# Patient Record
Sex: Female | Born: 1965 | Race: White | Hispanic: No | Marital: Single | Smoking: Current every day smoker
Health system: Southern US, Community
[De-identification: ages and names within clinical notes are randomized; demographics above are authoritative.]

## PROBLEM LIST (undated history)

## (undated) DIAGNOSIS — F17200 Nicotine dependence, unspecified, uncomplicated: Secondary | ICD-10-CM

## (undated) DIAGNOSIS — R000 Tachycardia, unspecified: Secondary | ICD-10-CM

## (undated) DIAGNOSIS — F419 Anxiety disorder, unspecified: Secondary | ICD-10-CM

## (undated) DIAGNOSIS — Z87828 Personal history of other (healed) physical injury and trauma: Secondary | ICD-10-CM

## (undated) DIAGNOSIS — N951 Menopausal and female climacteric states: Secondary | ICD-10-CM

## (undated) DIAGNOSIS — A159 Respiratory tuberculosis unspecified: Secondary | ICD-10-CM

## (undated) DIAGNOSIS — J189 Pneumonia, unspecified organism: Secondary | ICD-10-CM

## (undated) DIAGNOSIS — N870 Mild cervical dysplasia: Secondary | ICD-10-CM

## (undated) DIAGNOSIS — E785 Hyperlipidemia, unspecified: Secondary | ICD-10-CM

## (undated) DIAGNOSIS — N189 Chronic kidney disease, unspecified: Secondary | ICD-10-CM

## (undated) DIAGNOSIS — Z8619 Personal history of other infectious and parasitic diseases: Secondary | ICD-10-CM

## (undated) DIAGNOSIS — K30 Functional dyspepsia: Secondary | ICD-10-CM

## (undated) HISTORY — DX: Tachycardia, unspecified: R00.0

## (undated) HISTORY — DX: Chronic kidney disease, unspecified: N18.9

## (undated) HISTORY — PX: CHOLECYSTECTOMY: SHX55

## (undated) HISTORY — DX: Personal history of other infectious and parasitic diseases: Z86.19

## (undated) HISTORY — DX: Hyperlipidemia, unspecified: E78.5

## (undated) HISTORY — DX: Nicotine dependence, unspecified, uncomplicated: F17.200

## (undated) HISTORY — PX: TONSILLECTOMY: SUR1361

## (undated) HISTORY — DX: Pneumonia, unspecified organism: J18.9

## (undated) HISTORY — DX: Respiratory tuberculosis unspecified: A15.9

## (undated) HISTORY — DX: Mild cervical dysplasia: N87.0

---

## 1985-02-12 DIAGNOSIS — Z87828 Personal history of other (healed) physical injury and trauma: Secondary | ICD-10-CM

## 1985-02-12 HISTORY — DX: Personal history of other (healed) physical injury and trauma: Z87.828

## 1991-02-13 DIAGNOSIS — N870 Mild cervical dysplasia: Secondary | ICD-10-CM

## 1991-02-13 HISTORY — DX: Mild cervical dysplasia: N87.0

## 2001-03-25 ENCOUNTER — Other Ambulatory Visit: Admission: RE | Admit: 2001-03-25 | Discharge: 2001-03-25 | Payer: Self-pay | Admitting: Gynecology

## 2001-09-09 ENCOUNTER — Encounter: Admission: RE | Admit: 2001-09-09 | Discharge: 2001-09-09 | Payer: Self-pay | Admitting: Gynecology

## 2001-10-10 ENCOUNTER — Inpatient Hospital Stay (HOSPITAL_COMMUNITY): Admission: AD | Admit: 2001-10-10 | Discharge: 2001-10-13 | Payer: Self-pay | Admitting: Gynecology

## 2001-11-17 ENCOUNTER — Other Ambulatory Visit: Admission: RE | Admit: 2001-11-17 | Discharge: 2001-11-17 | Payer: Self-pay | Admitting: Gynecology

## 2002-04-03 ENCOUNTER — Encounter: Admission: RE | Admit: 2002-04-03 | Discharge: 2002-04-03 | Payer: Self-pay | Admitting: Gynecology

## 2002-04-03 ENCOUNTER — Encounter: Payer: Self-pay | Admitting: Gynecology

## 2004-10-12 ENCOUNTER — Other Ambulatory Visit: Admission: RE | Admit: 2004-10-12 | Discharge: 2004-10-12 | Payer: Self-pay | Admitting: Gynecology

## 2011-01-02 ENCOUNTER — Ambulatory Visit (INDEPENDENT_AMBULATORY_CARE_PROVIDER_SITE_OTHER): Payer: 59 | Admitting: Gynecology

## 2011-01-02 ENCOUNTER — Encounter: Payer: Self-pay | Admitting: *Deleted

## 2011-01-02 ENCOUNTER — Other Ambulatory Visit (HOSPITAL_COMMUNITY)
Admission: RE | Admit: 2011-01-02 | Discharge: 2011-01-02 | Disposition: A | Discharge status: Home / Self Care | Payer: 59 | Source: Ambulatory Visit | Attending: Gynecology | Admitting: Gynecology

## 2011-01-02 VITALS — BP 124/78 | Ht 62.0 in | Wt 140.0 lb

## 2011-01-02 DIAGNOSIS — B9689 Other specified bacterial agents as the cause of diseases classified elsewhere: Secondary | ICD-10-CM

## 2011-01-02 DIAGNOSIS — N814 Uterovaginal prolapse, unspecified: Secondary | ICD-10-CM

## 2011-01-02 DIAGNOSIS — Z01419 Encounter for gynecological examination (general) (routine) without abnormal findings: Secondary | ICD-10-CM

## 2011-01-02 DIAGNOSIS — N870 Mild cervical dysplasia: Secondary | ICD-10-CM | POA: Insufficient documentation

## 2011-01-02 DIAGNOSIS — N926 Irregular menstruation, unspecified: Secondary | ICD-10-CM

## 2011-01-02 DIAGNOSIS — Z131 Encounter for screening for diabetes mellitus: Secondary | ICD-10-CM

## 2011-01-02 DIAGNOSIS — N8111 Cystocele, midline: Secondary | ICD-10-CM

## 2011-01-02 DIAGNOSIS — N760 Acute vaginitis: Secondary | ICD-10-CM

## 2011-01-02 DIAGNOSIS — R823 Hemoglobinuria: Secondary | ICD-10-CM

## 2011-01-02 DIAGNOSIS — N898 Other specified noninflammatory disorders of vagina: Secondary | ICD-10-CM

## 2011-01-02 DIAGNOSIS — E7800 Pure hypercholesterolemia, unspecified: Secondary | ICD-10-CM

## 2011-01-02 DIAGNOSIS — Z13220 Encounter for screening for lipoid disorders: Secondary | ICD-10-CM

## 2011-01-02 DIAGNOSIS — B373 Candidiasis of vulva and vagina: Secondary | ICD-10-CM

## 2011-01-02 DIAGNOSIS — B3731 Acute candidiasis of vulva and vagina: Secondary | ICD-10-CM

## 2011-01-02 DIAGNOSIS — A499 Bacterial infection, unspecified: Secondary | ICD-10-CM

## 2011-01-02 MED ORDER — FLUCONAZOLE 150 MG PO TABS: 150.0000 mg | ORAL_TABLET | Freq: Once | ORAL | Status: AC

## 2011-01-02 MED ORDER — CLINDAMYCIN PHOSPHATE 2 % VA CREA: 1.0000 | TOPICAL_CREAM | Freq: Every day | VAGINAL | Status: AC

## 2011-01-02 NOTE — Progress Notes (Signed)
Stacey Martin 10-Apr-1965 [id number]        45 y.o.  for annual exam.  It's been several years since she has been in to see [place] and she has several issues as noted below.   Past medical history,surgical history, medications, allergies, family history and social history were all reviewed and documented in the EPIC chart. ROS:  Was performed and pertinent positives and negatives are included in the history.  Exam: chaperone present Filed Vitals:   01/02/11 1208  BP: 124/78   General appearance  Normal Skin grossly normal Head/Neck normal with no cervical or supraclavicular adenopathy thyroid normal Lungs  clear Cardiac RR, without RMG Abdominal  soft, nontender, without masses, organomegaly or hernia Breasts  examined lying and sitting without masses, retractions, discharge or axillary adenopathy. Pelvic  Ext/BUS/vagina  First degree cystocele, white discharge Cervix  normal  Pap done  Uterus  axial, with first degree uterine prolapse to within 2 fingerbreadths of the introitus,normal size, shape and contour, midline and mobile nontender   Adnexa  Without masses or tenderness    Anus and perineum  normal   Rectovaginal  normal sphincter tone without palpated masses or tenderness.    Assessment/Plan:  45 y.o. female for annual exam.    1. Uterine prolapse/cystocele. Patient relates vaginal bulging and pressure symptoms which I think is related to the uterine prolapse and cystocele. She does have some inflammatory change with vaginal discharge as noted below and we will treat this and see how this affects her symptoms. Options for management include observation, pessary, hysterectomy with anterior colporrhaphy was reviewed. She is not having any issues with urinary incontinence. Patient will think of her options and will follow up if she decides to pursue any of these. 2. White discharge. KOH wet prep is positive for yeast and BV. We'll treat with Diflucan 150x1 dose Cleocin vaginal cream  nightly times a week at her choice. Follow up if symptoms persist or recur 3. Irregular menses. Patient notes that her periods have been sporadic this past year which she'll go several months without a period. She is having hot flashes and night sweats. We'll go ahead and check baseline FSH TSH. If menopausal, reviewed keeping a menstrual calendar as long she is less frequent but regular periods we'll follow, if she has prolonged or atypical bleeding she'll represent. If her labs are normal will follow up with ultrasound and endometrial evaluation. Options for menopausal symptom treatment were reviewed to include OTC soy based up to and including HRT. She is not interested in HRT she said she'll try the soy. 4. Breast health. SBE monthly reviewed. She has not had a mammogram in a number of years I strongly recommended her to schedule this and she agrees to do so 5. Health maintenance. Will check baseline CBC glucose lipid profile urinalysis along with her TSH and FSH.  Patient will follow up for her labs and we'll go from there.    Dara Lords MD, 12:53 PM 01/02/2011

## 2011-01-03 NOTE — Progress Notes (Signed)
Addended by: Aura Camps on: 01/03/2011 11:15 AM   Modules accepted: Orders

## 2011-01-04 LAB — URINE CULTURE: Colony Count: NO GROWTH

## 2011-01-12 ENCOUNTER — Encounter: Payer: Self-pay | Admitting: Gynecology

## 2011-01-12 ENCOUNTER — Ambulatory Visit (INDEPENDENT_AMBULATORY_CARE_PROVIDER_SITE_OTHER): Payer: 59 | Admitting: Gynecology

## 2011-01-12 DIAGNOSIS — N8111 Cystocele, midline: Secondary | ICD-10-CM

## 2011-01-12 DIAGNOSIS — N814 Uterovaginal prolapse, unspecified: Secondary | ICD-10-CM

## 2011-01-12 NOTE — Progress Notes (Signed)
Patient presents to rediscuss options for her uterine prolapse and cystocele. I again discussed with her as previously the options to include observation, pessary, surgery to include TVH anterior colporrhaphy. She does not have a significant rectocele. The issues of ovarian conservation were also reviewed. She is premenopausal with less frequent menses and an elevated FSH and issues of keeping her ovaries for continued hormone production which may decrease cardiovascular risk in the perimenopausal period versus the risk of ovarian disease in the future such as benign issues requiring reoperation or ovarian cancer. Patient wants to proceed with [facility] anterior repair she couldn't think of the ovarian conservation issue and decide what she wants to do as far as her ovaries are concerned. We will move toward scheduling the surgery.

## 2011-01-15 ENCOUNTER — Telehealth: Payer: Self-pay

## 2011-01-15 NOTE — Telephone Encounter (Signed)
I called patient and left message at home number to call me regarding scheduling surgery.

## 2011-01-17 ENCOUNTER — Telehealth: Payer: Self-pay

## 2011-01-17 NOTE — Telephone Encounter (Signed)
I called pt. To let her know that I spoke with administrator and we have to look at her ins benefits just like everyone else.s regardless of company's reimbursement plan.  However, we could accept half of the prepayment amount and that would make her upfront cost $1146.  Patient said to let her consider and she will call me when ready to schedule. ka

## 2011-01-22 ENCOUNTER — Telehealth: Payer: Self-pay

## 2011-01-22 NOTE — Telephone Encounter (Signed)
Patient's mother called to make surgery prepymt. I explained that it was not necessary to make this payment until surgery scheduled and would need to be paidat least a week in advance of surgery.  She insisted she wanted to pay it now.  Cecil Cranker. Took the info.  Patient was there with her mother and said she will call me in the morning to schedule her surgery,she said she will be at work and I cannot call her.  ka

## 2011-01-24 ENCOUNTER — Telehealth: Payer: Self-pay

## 2011-01-24 NOTE — Telephone Encounter (Signed)
I called patient to set up her surgery.  We scheduled her for Wed, Jan 9 at 1pm at [facility].  She was instructed to check in by 11:45am. I mailed her [facility] pamphlet with dates/times.  We scheduled her preop consult for Friday Dec 28 3:00pm.  She will call me if any questions.

## 2011-01-24 NOTE — Telephone Encounter (Signed)
Dr. Velvet Bathe asked me to check with patient and see if she had decided if she wanted ovaries removed or not. They discussed this at her last office visit.  Patient said she does NOT want her ovaries removed and I let Dr. Velvet Bathe know.

## 2011-02-09 ENCOUNTER — Encounter: Payer: Self-pay | Admitting: Gynecology

## 2011-02-09 ENCOUNTER — Ambulatory Visit (INDEPENDENT_AMBULATORY_CARE_PROVIDER_SITE_OTHER): Payer: 59 | Admitting: Gynecology

## 2011-02-09 VITALS — BP 128/80

## 2011-02-09 DIAGNOSIS — N8111 Cystocele, midline: Secondary | ICD-10-CM

## 2011-02-09 DIAGNOSIS — N814 Uterovaginal prolapse, unspecified: Secondary | ICD-10-CM

## 2011-02-09 DIAGNOSIS — N816 Rectocele: Secondary | ICD-10-CM

## 2011-02-09 NOTE — Patient Instructions (Signed)
Followup for surgery is scheduled 

## 2011-02-09 NOTE — Progress Notes (Signed)
Stacey Martin 08/01/1965 [id number]  Patient presents for preop appointment.   Chief complaint: vaginal pressure and bulging sensation  History of present illness: 45 y.o.  G3 P3 female not currently sexually active with more irregular periods over the past year and increasing vaginal pressure and bulging symptoms. Examination shows a second degree cystocele and uterine prolapse with a mild rectocele. FSH was measured and shown to be elevated.  Options for management were reviewed to include expectant management, trial of pessary or surgical repair and she wants to proceed with hysterectomy, anterior colporrhaphy.   Past medical history,surgical history, medications, allergies, family history and social history were all reviewed and documented in the EPIC chart. ROS:  Was performed and pertinent positives and negatives are included in the history of present illness.  Exam: General: well developed, well nourished female, no acute distress HEENT: normal  Lungs: clear to auscultation without wheezing, rales or rhonchi  Cardiac: regular rate without rubs, murmurs or gallops  Abdomen: soft, nontender without masses, guarding, rebound, organomegaly  Pelvic: external bus vagina: second degree cystocele  Cervix: grossly normal  Uterus: normal size, midline and mobile, nontender with second degree uterine prolapse Adnexa: without masses or tenderness  Rectovaginal exam within normal limits mild rectocele with digital rectovaginal exam.    Assessment/Plan:  45 year old G3 P3 female peri-menopausal with symptomatic cystocele, uterine prolapse, mild rectocele. She does have irregular menses with elevated FSH. Options for management were reviewed to include expectant management, trial of pessary and surgery and she wants to proceed with surgery.  She does have a mild rectocele which is noted on rectovaginal exam but does not present any deformity with straining and her main symptoms are related to her  cystocele and uterine prolapse. Options for rectocele repair now or expectant management were reviewed she prefer expectant management and proceeding with the symptomatic cystocele and uterine prolapse now accepting the risk of rectocele repair the future. She also understands their are no guarantees of longevity with the repair surgery and she may have a recurrent cystocele or vaginal prolapse in the future and she understands and accepts this. She is perimenopausal both historically as well as with an elevated FSH and issues of ovarian conservation were reviewed with her in light of this. Options for removing both ovaries or keeping both ovaries was discussed. The potential benefit of cardiovascular protection and bone health retaining her ovaries versus the risk of ovarian disease in the future both benign and ovarian cancer requiring treatment and reoperation was discussed versus removing her ovaries and the issue of hypoestrogenism, symptoms, possible HRT and it's risks to include the WHI study, stroke, heart attack, DVT and breast cancer risks and the patient feels very strongly that she wants to keep both ovaries regardless and she understands her perimenopausal status and issues of keeping her ovaries. She does give me permission to remove one or both ovaries if at the time of surgery it is my best recommendation to do so and she would accept this. I also reviewed with her there is no guarantees that we will be able to complete the surgery vaginally and I may need to proceed with laparoscopy to assist in the hysterectomy or proceed with an abdominal incision and do a total abdominal hysterectomy she understands and accepts this. She understands it will be a longer more painful recovery period.   Sexuality following hysterectomy was also reviewed and the potential for persistent orgasmic dysfunction as well as persistent dyspareunia was discussed understood and accepted. The  absolute and irreversible  sterility associated with hysterectomy was also reviewed. The acute intraoperative postoperative courses were discussed wasand the risks of infection, prolonged antibiotics, abscess or hematoma formation requiring reoperation and drainage was all discussed with her. The risk of hemorrhage necessitating transfusion and the risks of transfusion discussed to include transfusion reaction, hepatitis, HIV, mad cow disease and other unknown entities. The risk of inadvertent injury to internal organs, either immediately recognized or delay recognized, including bowel, bladder, ureters, vessels and nerves necessitating major exploratory reparative surgeries and future reparative surgeries including ostomy formation, bowel resection, bladder repair, ureteral damage repair was all discussed understood and accepted. She understands she is at increased risk given her prior surgeries and accepts this. The patient's questions were answered to her satisfaction she is ready to proceed with surgery.    Dara Lords MD, 5:35 PM 02/09/2011

## 2011-02-09 NOTE — H&P (Signed)
Stacey Martin 05-18-1965 [id number]   History and Physical    Chief complaint: vaginal pressure and bulging sensation  History of present illness: 45 y.o.  G3 P3 female not currently sexually active with more irregular periods over the past year and increasing vaginal pressure and bulging symptoms. Examination shows a second degree cystocele and uterine prolapse with a mild rectocele. FSH was measured and shown to be elevated.  Options for management were reviewed to include expectant management, trial of pessary or surgical repair and she wants to proceed with hysterectomy, anterior colporrhaphy.   Past medical history,surgical history, medications, allergies, family history and social history were all reviewed and documented in the EPIC chart. ROS:  Was performed and pertinent positives and negatives are included in the history of present illness.  Exam: General: well developed, well nourished female, no acute distress HEENT: normal  Lungs: clear to auscultation without wheezing, rales or rhonchi  Cardiac: regular rate without rubs, murmurs or gallops  Abdomen: soft, nontender without masses, guarding, rebound, organomegaly  Pelvic: external bus vagina: second degree cystocele  Cervix: grossly normal  Uterus: normal size, midline and mobile, nontender with second degree uterine prolapse Adnexa: without masses or tenderness  Rectovaginal exam within normal limits mild rectocele with digital rectovaginal exam.    Assessment/Plan:  45 year old G3 P3 female peri-menopausal with symptomatic cystocele, uterine prolapse, mild rectocele. She does have irregular menses with elevated FSH. Options for management were reviewed to include expectant management, trial of pessary and surgery and she wants to proceed with surgery.  She does have a mild rectocele which is noted on rectovaginal exam but does not present any deformity with straining and her main symptoms are related to her cystocele and  uterine prolapse. Options for rectocele repair now or expectant management were reviewed she prefer expectant management and proceeding with the symptomatic cystocele and uterine prolapse now accepting the risk of rectocele repair the future. She also understands their are no guarantees of longevity with the repair surgery and she may have a recurrent cystocele or vaginal prolapse in the future and she understands and accepts this. She is perimenopausal both historically as well as with an elevated FSH and issues of ovarian conservation were reviewed with her in light of this. Options for removing both ovaries or keeping both ovaries was discussed. The potential benefit of cardiovascular protection and bone health retaining her ovaries versus the risk of ovarian disease in the future both benign and ovarian cancer requiring treatment and reoperation was discussed versus removing her ovaries and the issue of hypoestrogenism, symptoms, possible HRT and it's risks to include the WHI study, stroke, heart attack, DVT and breast cancer risks and the patient feels very strongly that she wants to keep both ovaries regardless and she understands her perimenopausal status and issues of keeping her ovaries. She does give me permission to remove one or both ovaries if at the time of surgery it is my best recommendation to do so and she would accept this. I also reviewed with her there is no guarantees that we will be able to complete the surgery vaginally and I may need to proceed with laparoscopy to assist in the hysterectomy or proceed with an abdominal incision and do a total abdominal hysterectomy she understands and accepts this. She understands it will be a longer more painful recovery period.   Sexuality following hysterectomy was also reviewed and the potential for persistent orgasmic dysfunction as well as persistent dyspareunia was discussed understood and accepted. The  absolute and irreversible sterility associated  with hysterectomy was also reviewed. The acute intraoperative postoperative courses were discussed wasand the risks of infection, prolonged antibiotics, abscess or hematoma formation requiring reoperation and drainage was all discussed with her. The risk of hemorrhage necessitating transfusion and the risks of transfusion discussed to include transfusion reaction, hepatitis, HIV, mad cow disease and other unknown entities. The risk of inadvertent injury to internal organs, either immediately recognized or delay recognized, including bowel, bladder, ureters, vessels and nerves necessitating major exploratory reparative surgeries and future reparative surgeries including ostomy formation, bowel resection, bladder repair, ureteral damage repair was all discussed understood and accepted. She understands she is at increased risk given her prior surgeries and accepts this. The patient's questions were answered to her satisfaction she is ready to proceed with surgery.     Dara Lords MD, 5:54 PM 02/09/2011

## 2011-02-19 ENCOUNTER — Encounter (HOSPITAL_BASED_OUTPATIENT_CLINIC_OR_DEPARTMENT_OTHER): Payer: Self-pay | Admitting: *Deleted

## 2011-02-19 NOTE — Progress Notes (Signed)
NPO AFTER MN  WITH EXCEPTION WATER/ GATORADE/ BLACK COFFEE , NO CREAM/MILK PRODUCTS UNTIL 0700. ARRIVES 1145. NEEDS CBC AND SERUM PREG. (PT STATES WILL GO AFTER 1600 02-20-2011). REVIEWED [facility] GUIDELINES .

## 2011-02-20 LAB — CBC
HCT: 43.5 % (ref 36.0–46.0)
MCHC: 34.3 g/dL (ref 30.0–36.0)
MCV: 92.4 fL (ref 78.0–100.0)
RDW: 12.7 % (ref 11.5–15.5)

## 2011-02-21 ENCOUNTER — Encounter (HOSPITAL_BASED_OUTPATIENT_CLINIC_OR_DEPARTMENT_OTHER): Payer: Self-pay | Admitting: Anesthesiology

## 2011-02-21 ENCOUNTER — Encounter (HOSPITAL_BASED_OUTPATIENT_CLINIC_OR_DEPARTMENT_OTHER): Payer: Self-pay | Admitting: *Deleted

## 2011-02-21 ENCOUNTER — Encounter (HOSPITAL_BASED_OUTPATIENT_CLINIC_OR_DEPARTMENT_OTHER)
Admission: RE | Disposition: A | Discharge status: Home / Self Care | Payer: Self-pay | Source: Ambulatory Visit | Attending: Gynecology

## 2011-02-21 ENCOUNTER — Ambulatory Visit (HOSPITAL_BASED_OUTPATIENT_CLINIC_OR_DEPARTMENT_OTHER): Payer: 59 | Admitting: Anesthesiology

## 2011-02-21 ENCOUNTER — Other Ambulatory Visit: Payer: Self-pay | Admitting: Gynecology

## 2011-02-21 ENCOUNTER — Ambulatory Visit (HOSPITAL_BASED_OUTPATIENT_CLINIC_OR_DEPARTMENT_OTHER)
Admission: RE | Admit: 2011-02-21 | Discharge: 2011-02-22 | Disposition: A | Discharge status: Home / Self Care | Payer: 59 | Source: Ambulatory Visit | Attending: Gynecology | Admitting: Gynecology

## 2011-02-21 DIAGNOSIS — Z9889 Other specified postprocedural states: Secondary | ICD-10-CM

## 2011-02-21 DIAGNOSIS — Z01812 Encounter for preprocedural laboratory examination: Secondary | ICD-10-CM | POA: Insufficient documentation

## 2011-02-21 DIAGNOSIS — N814 Uterovaginal prolapse, unspecified: Secondary | ICD-10-CM | POA: Insufficient documentation

## 2011-02-21 DIAGNOSIS — N8000 Endometriosis of the uterus, unspecified: Secondary | ICD-10-CM | POA: Insufficient documentation

## 2011-02-21 HISTORY — DX: Personal history of other (healed) physical injury and trauma: Z87.828

## 2011-02-21 HISTORY — DX: Menopausal and female climacteric states: N95.1

## 2011-02-21 HISTORY — DX: Anxiety disorder, unspecified: F41.9

## 2011-02-21 HISTORY — PX: CYSTOCELE REPAIR: SHX163

## 2011-02-21 HISTORY — DX: Functional dyspepsia: K30

## 2011-02-21 HISTORY — PX: RECTOCELE REPAIR: SHX761

## 2011-02-21 HISTORY — PX: VAGINAL HYSTERECTOMY: SHX2639

## 2011-02-21 SURGERY — HYSTERECTOMY, VAGINAL
Anesthesia: General | Site: Uterus | Wound class: Clean Contaminated

## 2011-02-21 MED ORDER — PROPOFOL 10 MG/ML IV EMUL
INTRAVENOUS | Status: DC | PRN
  Administered 2011-02-21: 180 mg via INTRAVENOUS

## 2011-02-21 MED ORDER — MENTHOL 3 MG MT LOZG: 1.0000 | LOZENGE | OROMUCOSAL | Status: DC | PRN

## 2011-02-21 MED ORDER — FENTANYL CITRATE 0.05 MG/ML IJ SOLN
INTRAMUSCULAR | Status: DC | PRN
  Administered 2011-02-21 (×5): 25 ug via INTRAVENOUS
  Administered 2011-02-21: 50 ug via INTRAVENOUS
  Administered 2011-02-21 (×5): 25 ug via INTRAVENOUS

## 2011-02-21 MED ORDER — DEXTROSE 5 % IV SOLN
1.0000 g | INTRAVENOUS | Status: AC
  Administered 2011-02-21: 1 g via INTRAVENOUS

## 2011-02-21 MED ORDER — DEXTROSE IN LACTATED RINGERS 5 % IV SOLN
INTRAVENOUS | Status: DC
  Administered 2011-02-21: 125 mL/h via INTRAVENOUS

## 2011-02-21 MED ORDER — MORPHINE SULFATE 4 MG/ML IJ SOLN: 2.0000 mg | INTRAMUSCULAR | Status: DC | PRN

## 2011-02-21 MED ORDER — PROMETHAZINE HCL 25 MG/ML IJ SOLN: 6.2500 mg | INTRAMUSCULAR | Status: DC | PRN

## 2011-02-21 MED ORDER — ONDANSETRON HCL 4 MG PO TABS: 4.0000 mg | ORAL_TABLET | Freq: Four times a day (QID) | ORAL | Status: DC | PRN

## 2011-02-21 MED ORDER — ONDANSETRON HCL 4 MG/2ML IJ SOLN: 4.0000 mg | Freq: Four times a day (QID) | INTRAMUSCULAR | Status: DC | PRN

## 2011-02-21 MED ORDER — OXYCODONE-ACETAMINOPHEN 5-325 MG PO TABS
1.0000 | ORAL_TABLET | ORAL | Status: DC | PRN
  Administered 2011-02-21 – 2011-02-22 (×3): 1 via ORAL

## 2011-02-21 MED ORDER — ONDANSETRON HCL 4 MG/2ML IJ SOLN
INTRAMUSCULAR | Status: DC | PRN
  Administered 2011-02-21: 4 mg via INTRAVENOUS

## 2011-02-21 MED ORDER — FENTANYL CITRATE 0.05 MG/ML IJ SOLN: 25.0000 ug | INTRAMUSCULAR | Status: DC | PRN

## 2011-02-21 MED ORDER — LACTATED RINGERS IV SOLN
INTRAVENOUS | Status: DC
  Administered 2011-02-21 (×3): via INTRAVENOUS

## 2011-02-21 MED ORDER — DEXAMETHASONE SODIUM PHOSPHATE 4 MG/ML IJ SOLN
INTRAMUSCULAR | Status: DC | PRN
Start: 2011-02-21 — End: 2011-02-21
  Administered 2011-02-21: 10 mg via INTRAVENOUS

## 2011-02-21 MED ORDER — DEXTROSE IN LACTATED RINGERS 5 % IV SOLN: INTRAVENOUS | Status: DC

## 2011-02-21 MED ORDER — DIPHENHYDRAMINE HCL 50 MG PO CAPS: 50.0000 mg | ORAL_CAPSULE | Freq: Four times a day (QID) | ORAL | Status: DC | PRN

## 2011-02-21 MED ORDER — KETOROLAC TROMETHAMINE 30 MG/ML IJ SOLN: 30.0000 mg | Freq: Four times a day (QID) | INTRAMUSCULAR | Status: DC

## 2011-02-21 MED ORDER — MIDAZOLAM HCL 5 MG/5ML IJ SOLN
INTRAMUSCULAR | Status: DC | PRN
  Administered 2011-02-21: 1 mg via INTRAVENOUS

## 2011-02-21 MED ORDER — LIDOCAINE-EPINEPHRINE (PF) 1 %-1:200000 IJ SOLN
INTRAMUSCULAR | Status: DC | PRN
  Administered 2011-02-21: 8 mL

## 2011-02-21 MED ORDER — LIDOCAINE HCL (CARDIAC) 20 MG/ML IV SOLN
INTRAVENOUS | Status: DC | PRN
  Administered 2011-02-21: 70 mg via INTRAVENOUS

## 2011-02-21 MED ORDER — KETOROLAC TROMETHAMINE 30 MG/ML IJ SOLN
30.0000 mg | Freq: Four times a day (QID) | INTRAMUSCULAR | Status: DC
  Administered 2011-02-21 – 2011-02-22 (×2): 30 mg via INTRAVENOUS

## 2011-02-21 MED ORDER — ESTRADIOL 0.1 MG/GM VA CREA
TOPICAL_CREAM | VAGINAL | Status: DC | PRN
  Administered 2011-02-21: 1 via VAGINAL

## 2011-02-21 MED ORDER — KETOROLAC TROMETHAMINE 30 MG/ML IJ SOLN
INTRAMUSCULAR | Status: DC | PRN
  Administered 2011-02-21: 30 mg via INTRAVENOUS

## 2011-02-21 MED ORDER — EPHEDRINE SULFATE 50 MG/ML IJ SOLN
INTRAMUSCULAR | Status: DC | PRN
Start: 2011-02-21 — End: 2011-02-21
  Administered 2011-02-21: 10 mg via INTRAVENOUS
  Administered 2011-02-21: 5 mg via INTRAVENOUS

## 2011-02-21 MED ORDER — ZOLPIDEM TARTRATE 5 MG PO TABS
5.0000 mg | ORAL_TABLET | Freq: Every evening | ORAL | Status: DC | PRN
  Administered 2011-02-21 – 2011-02-22 (×2): 5 mg via ORAL

## 2011-02-21 SURGICAL SUPPLY — 51 items
BAG URINE DRAINAGE (UROLOGICAL SUPPLIES) ×4 IMPLANT
BLADE SURG 10 STRL SS (BLADE) ×8 IMPLANT
CANISTER SUCTION 2500CC (MISCELLANEOUS) ×4 IMPLANT
CATH FOLEY 2WAY SLVR  5CC 16FR (CATHETERS) ×1
CATH FOLEY 2WAY SLVR 5CC 16FR (CATHETERS) ×3 IMPLANT
CLOTH BEACON ORANGE TIMEOUT ST (SAFETY) ×4 IMPLANT
COVER MAYO STAND STRL (DRAPES) ×4 IMPLANT
COVER TABLE BACK 60X90 (DRAPES) ×4 IMPLANT
DRAPE LG THREE QUARTER DISP (DRAPES) ×8 IMPLANT
DRAPE UNDERBUTTOCKS STRL (DRAPE) ×4 IMPLANT
ELECT REM PT RETURN 9FT ADLT (ELECTROSURGICAL) ×4
ELECTRODE REM PT RTRN 9FT ADLT (ELECTROSURGICAL) ×3 IMPLANT
GAUZE SPONGE 4X4 16PLY XRAY LF (GAUZE/BANDAGES/DRESSINGS) IMPLANT
GLOVE BIO SURGEON STRL SZ 6.5 (GLOVE) ×1 IMPLANT
GLOVE BIO SURGEON STRL SZ7 (GLOVE) ×1 IMPLANT
GLOVE BIO SURGEON STRL SZ7.5 (GLOVE) ×8 IMPLANT
GLOVE ECLIPSE 6.0 STRL STRAW (GLOVE) ×1 IMPLANT
GLOVE ECLIPSE 6.5 STRL STRAW (GLOVE) ×4 IMPLANT
GLOVE ECLIPSE 7.0 STRL STRAW (GLOVE) IMPLANT
GLOVE ECLIPSE 7.5 STRL STRAW (GLOVE) ×1 IMPLANT
GLOVE INDICATOR 7.0 STRL GRN (GLOVE) ×1 IMPLANT
GLOVE INDICATOR 7.5 STRL GRN (GLOVE) IMPLANT
GLOVE INDICATOR 8.0 STRL GRN (GLOVE) ×1 IMPLANT
GOWN PREVENTION PLUS LG XLONG (DISPOSABLE) ×5 IMPLANT
GOWN STRL REIN XL XLG (GOWN DISPOSABLE) ×5 IMPLANT
LEGGING LITHOTOMY PAIR STRL (DRAPES) ×4 IMPLANT
NDL SPNL 22GX3.5 QUINCKE BK (NEEDLE) ×3 IMPLANT
NEEDLE SPNL 22GX3.5 QUINCKE BK (NEEDLE) ×4 IMPLANT
NS IRRIG 500ML POUR BTL (IV SOLUTION) ×4 IMPLANT
PACK BASIN DAY SURGERY FS (CUSTOM PROCEDURE TRAY) ×4 IMPLANT
PACKING VAGINAL (PACKING) ×1 IMPLANT
PAD OB MATERNITY 4.3X12.25 (PERSONAL CARE ITEMS) ×4 IMPLANT
PAD PREP 24X48 CUFFED NSTRL (MISCELLANEOUS) ×4 IMPLANT
PENCIL BUTTON HOLSTER BLD 10FT (ELECTRODE) ×4 IMPLANT
SPONGE LAP 4X18 X RAY DECT (DISPOSABLE) ×4 IMPLANT
SUT VIC AB 0 CT1 18XCR BRD 8 (SUTURE) ×6 IMPLANT
SUT VIC AB 0 CT1 36 (SUTURE) ×4 IMPLANT
SUT VIC AB 0 CT1 8-18 (SUTURE) ×8
SUT VIC AB 2-0 SH 27 (SUTURE) ×24
SUT VIC AB 2-0 SH 27XBRD (SUTURE) IMPLANT
SUT VICRYL 0 TIES 12 18 (SUTURE) ×4 IMPLANT
SUT VICRYL AB 2 0 TIE (SUTURE) IMPLANT
SUT VICRYL AB 2 0 TIES (SUTURE)
SYR BULB IRRIGATION 50ML (SYRINGE) ×4 IMPLANT
SYR CONTROL 10ML LL (SYRINGE) ×4 IMPLANT
SYRINGE 10CC LL (SYRINGE) ×4 IMPLANT
TOWEL OR 17X24 6PK STRL BLUE (TOWEL DISPOSABLE) ×8 IMPLANT
TRAY DSU PREP LF (CUSTOM PROCEDURE TRAY) ×4 IMPLANT
TUBE CONNECTING 12X1/4 (SUCTIONS) ×4 IMPLANT
WATER STERILE IRR 500ML POUR (IV SOLUTION) ×4 IMPLANT
YANKAUER SUCT BULB TIP NO VENT (SUCTIONS) ×4 IMPLANT

## 2011-02-21 NOTE — Progress Notes (Signed)
Pt ambulated fully down hall and back with assistance,- tolerated well

## 2011-02-21 NOTE — Progress Notes (Signed)
Pt ambulated halfway down hall and back with assistance,- tolerated well

## 2011-02-21 NOTE — Anesthesia Procedure Notes (Addendum)
Procedure Name: LMA Insertion Date/Time: 02/21/2011 1:08 PM Performed by: Renella Cunas D Pre-anesthesia Checklist: Patient identified, Emergency Drugs available, Suction available and Patient being monitored Patient Re-evaluated:Patient Re-evaluated prior to inductionOxygen Delivery Method: Circle System Utilized Preoxygenation: Pre-oxygenation with 100% oxygen Intubation Type: IV induction Ventilation: Mask ventilation without difficulty LMA: LMA with gastric port inserted LMA Size: 4.0 Number of attempts: 1 Placement Confirmation: positive ETCO2 Tube secured with: Tape Dental Injury: Teeth and Oropharynx as per pre-operative assessment

## 2011-02-21 NOTE — Anesthesia Preprocedure Evaluation (Signed)
Anesthesia Evaluation  Patient identified by MRN, date of birth, ID band Patient awake    Reviewed: Allergy & Precautions, H&P , NPO status , Patient's Chart, lab work & pertinent test results  Airway Mallampati: II TM Distance: >3 FB Neck ROM: Full    Dental No notable dental hx.    Pulmonary neg pulmonary ROS, Current Smoker,  clear to auscultation  Pulmonary exam normal       Cardiovascular neg cardio ROS Regular Normal    Neuro/Psych Negative Neurological ROS  Negative Psych ROS   GI/Hepatic negative GI ROS, Neg liver ROS,   Endo/Other  Negative Endocrine ROS  Renal/GU negative Renal ROS  Genitourinary negative   Musculoskeletal negative musculoskeletal ROS (+)   Abdominal   Peds negative pediatric ROS (+)  Hematology negative hematology ROS (+)   Anesthesia Other Findings   Reproductive/Obstetrics negative OB ROS                           Anesthesia Physical Anesthesia Plan  ASA: II  Anesthesia Plan: General   Post-op Pain Management:    Induction: Intravenous  Airway Management Planned: Oral ETT  Additional Equipment:   Intra-op Plan:   Post-operative Plan: Extubation in OR  Informed Consent: I have reviewed the patients History and Physical, chart, labs and discussed the procedure including the risks, benefits and alternatives for the proposed anesthesia with the patient or authorized representative who has indicated his/her understanding and acceptance.   Dental advisory given  Plan Discussed with: CRNA  Anesthesia Plan Comments:         Anesthesia Quick Evaluation  

## 2011-02-21 NOTE — Progress Notes (Signed)
Received report as caregiver. 

## 2011-02-21 NOTE — Transfer of Care (Signed)
Immediate Anesthesia Transfer of Care Note  Patient: Stacey Martin  Procedure(s) Performed:  HYSTERECTOMY VAGINAL; ANTERIOR REPAIR (CYSTOCELE) -  Dr. Lily Peer will be assistant.; POSTERIOR REPAIR (RECTOCELE)  Patient Location: Patient transported to PACU with oxygen via face mask at 6 Liters / Min  Anesthesia Type: General  Level of Consciousness: awake and alert   Airway & Oxygen Therapy: Patient Spontanous Breathing and Patient connected to face mask oxygen Post-op Assessment: Report given to PACU RN and Post -op Vital signs reviewed and stable  Post vital signs: Reviewed and stable  Complications: No apparent anesthesia complications

## 2011-02-21 NOTE — Anesthesia Postprocedure Evaluation (Signed)
  Anesthesia Post-op Note  Patient: Stacey Martin  Procedure(s) Performed:  HYSTERECTOMY VAGINAL; ANTERIOR REPAIR (CYSTOCELE) -  Dr. Lily Peer will be assistant.; POSTERIOR REPAIR (RECTOCELE)  Patient Location: PACU  Anesthesia Type: General  Level of Consciousness: awake and alert   Airway and Oxygen Therapy: Patient Spontanous Breathing  Post-op Pain: mild  Post-op Assessment: Post-op Vital signs reviewed, Patient's Cardiovascular Status Stable, Respiratory Function Stable, Patent Airway and No signs of Nausea or vomiting  Post-op Vital Signs: stable  Complications: No apparent anesthesia complications

## 2011-02-21 NOTE — Op Note (Signed)
Stacey Martin 12-29-1965 [id number]   Post Operative Note   Date of surgery:  02/21/2011  Pre Op Dx: cystocele, rectocele, uterine prolapse  Post Op Dx  same  Procedure:  Anterior anterior colporrhaphy, posterior colporrhaphy, total vaginal hysterectomy  Surgeon:  Dara Lords  Assistant:  Reynaldo Minium  Anesthesia:  General  EBL:  Less than 50 cc  Complications:  None  Specimen:  uterus to pathology  Findings: EUA:  External BUS vagina with cystocele, uterine prolapse and rectocele, cervix grossly normal, uterus normal size midline mobile, adnexa without gross masses   Operative:  Cystocele protruding through the introital opening, cervix within 1 fingerbreadth of introital opening. Rectocele with gross vaginal exam showing redundant vaginal tissue most evident with rectovaginal exam. Cervix and uterus grossly normal. Right and left ovaries and fallopian tubes segments grossly normal. Cul-de-sac a limited inspection grossly normal  Procedure:  . Patient was taken to the operating room, underwent general endotracheal anesthesia, was placed in the low dorsal lithotomy position and received a perineal/vaginal preparation with Betadine solution. EUA was performed and an indwelling Foley catheter was placed. The patient was draped in the usual fashion and placed in the high dorsal lithotomy position. A weighted speculum was placed within the vagina and the cervix was grasped with a single-tooth tenaculum and the cervical mucosa was circumferentially injected using 1% lidocaine with 1/200 epinephrine, a total of 8 cc.  The cervical mucosa was then circumferentially incised and the paracervical planes were sharply developed. The posterior cul-de-sac was sharply entered without difficulty and a long weighted speculum was placed. The right and left uterosacral ligaments were identified, clamped cut and ligated using 0 Vicryl suture and tagged for future reference. The anterior vesicouterine  plane was sharply developed and the anterior cul-de-sac was ultimately entered without difficulty. The uterus was then progressively freed from its attachments to clamping, cutting and ligating of the cardinal ligaments and parametrial tissues using 0 Vicryl suture. The uterine/ovarian pedicles were clamped bilaterally and the uterus was excised from the patient and sent to pathology. The pedicles were doubly ligated using 0 Vicryl suture first in a simple simple stitch followed by a suture ligature.  The ovaries and fallopian tubes were normal and the cul-de-sac showed no evidence of pathology. The intestines were packed from the operative site using a tagged tail sponge and the upper anterior vaginal cuff was grasped with Allis clamps and the anterior colporrhaphy was initiated with progressive undermining of the vesicovaginal plane and incision of the vaginal mucosa to within a fingerbreadth of the urethral opening. Through sharp and blunt dissection the bladder was freed from the underlying vaginal mucosa laterally and periurethrally. The cystocele was reduced through progressive imbricating stitches incorporating the pubovaginal fascia in interrupted 2-0 Vicryl sutures. The excess vaginal mucosa was excised and disposed of, totally normal in appearance and the anterior vaginal incision was closed using 2-0 Vicryl suture in a running interlocking stitch incorporating the underlying pubovaginal fascia to close the dead space. The tagged tail sponge was then removed and the upper vagina was closed anterior to posterior using 0 Vicryl suture in interrupted figure-of-eight stitch. As previously discussed with the patient preoperatively my intraoperative exam did show a fair amount of redundant posterior vaginal mucosa with rectocele and we decided to proceed with a posterior colporrhaphy. Two Allis clamps were placed lateral to the posterior fourchette and a transverse incision was made. The vaginal mucosa was  sharply incised and the rectovaginal plane was sharply developed progressively by  undermining of the vaginal mucosa to within a fingerbreadth above the upper vaginal cuff. The perirectal planes were then developed bilaterally through sharp and blunt dissection without difficulty and the rectocele was then progressively reduced through intermittent 2-0 Vicryl sutures incorporating the perirectal fascia. The excess vaginal mucosa was excised and the vaginal mucosa was then reapproximated using 2-0 Vicryl in a running interlocking stitch incorporating the underlying rectovaginal fascia to close the dead space.  Examination showed a good vaginal depth and width and the vagina was then packed with Estrace coated vaginal packing. Throughout the case clear yellow urine was noted. The patient was then placed in the supine position, received intraoperative Toradol and was awakened without difficulty taken to the recovery room in good condition having tolerating the procedure well.    Dara Lords MD, 3:37 PM 02/21/2011

## 2011-02-21 NOTE — H&P (Signed)
  The patient was examined.  I reviewed the proposed surgery and consent form with the patient.  The dictated history and physical is current and accurate and all questions were answered. The patient is ready to proceed with surgery and has a realistic understanding and expectation for the outcome.   Dara Lords MD, 12:58 PM 02/21/2011

## 2011-02-22 ENCOUNTER — Encounter (HOSPITAL_BASED_OUTPATIENT_CLINIC_OR_DEPARTMENT_OTHER): Payer: Self-pay | Admitting: Gynecology

## 2011-02-22 LAB — CBC
MCV: 91.6 fL (ref 78.0–100.0)
Platelets: 275 10*3/uL (ref 150–400)
RBC: 4.06 MIL/uL (ref 3.87–5.11)
RDW: 12.3 % (ref 11.5–15.5)
WBC: 21.1 10*3/uL — ABNORMAL HIGH (ref 4.0–10.5)

## 2011-02-22 MED ORDER — ONDANSETRON HCL 4 MG PO TABS: 4.0000 mg | ORAL_TABLET | Freq: Four times a day (QID) | ORAL | Status: AC | PRN

## 2011-02-22 MED ORDER — OXYCODONE-ACETAMINOPHEN 5-325 MG PO TABS: 1.0000 | ORAL_TABLET | ORAL | Status: AC | PRN

## 2011-02-22 NOTE — Progress Notes (Signed)
Patient ID: Stacey Martin, female   DOB: [birth year], 46 y.o.   MRN: [id number] Stacey Martin [birth year] [id number]   1 Day Post-Op Procedure(s) (LRB): HYSTERECTOMY VAGINAL (N/A) ANTERIOR REPAIR (CYSTOCELE) (N/A) POSTERIOR REPAIR (RECTOCELE) (N/A)  Subjective: Patient reports feels well, no complaints, pain severity reported mild, yes taking PO, foley catheter in place,  yes ambulating, yespassing flatus  Objective: Afeb, VSS   EXAM General: awake, alert and no distress Resp: rhonchi clear to auscultation bilaterally Cardio: regular rate and rhythm, S1, S2 normal, no murmur, click, rub or gallop GI: normal findings:soft, non-tender; bowel sounds normal; no masses,  no organomegaly Lower Extremities: Without swelling or tenderness Vaginal packing removed with scant bleeding  Assessment: s/p Procedure(s): HYSTERECTOMY VAGINAL ANTERIOR REPAIR (CYSTOCELE) POSTERIOR REPAIR (RECTOCELE): progressing well, post op Hb 12.8, ready for discharge.  Plan: Discharge home today after foley out and voiding.  Precautions, instructions and follow up were discussed with the patient.  Prescriptions provided  Per AVS.  Patient to call the office to arrange a post-operative appointmant in 2 weeks.  Dara Lords, MD 02/22/2011 7:39 AM

## 2011-02-22 NOTE — Progress Notes (Signed)
Dr. Audie Box in . Chart reviewed. Packing d/c'd by dr. Audie Box.  Foley d/c'd per order. Pt encouraged to force fluids. Ride to arrive at 0900.  Will review d/c instructions then.

## 2011-02-26 ENCOUNTER — Telehealth: Payer: Self-pay

## 2011-02-26 NOTE — Discharge Summary (Signed)
Stacey Martin [birth year] [id number]   Discharge Summary  Date of Admission:  02/21/2011  Date of Discharge:  02/22/2011  Discharge Diagnosis:  #1 Uterine prolapse, #2 cystocele, #3 rectocele, #4 adenomyosis  Procedure:  Procedure(s): HYSTERECTOMY VAGINAL ANTERIOR REPAIR (CYSTOCELE) POSTERIOR REPAIR (RECTOCELE)  Pathology: [id number] FINAL DIAGNOSIS Diagnosis Uterus and cervix - BENIGN WEAKLY PROLIFERATIVE - INACTIVE ENDOMETRIUM; NEGATIVE FOR HYPERPLASIA OR MALIGNANCY. - UTERINE ADENOMYOSIS. - BENIGN CERVICAL MUCOSA; NEGATIVE FOR INTRAEPITHELIAL LESION OR MALIGNANCY. - UNREMARKABLE UTERINE SEROSA.  Hospital Course:  Patient underwent an uncomplicated total vaginal hysterectomy, anterior/posterior colporrhaphy 02/21/2011. Patient's postoperative course was uncomplicated and she was discharged on postoperative day #1 ambulating well, tolerating a regular diet, voiding without difficulty with a postoperative hemoglobin of 12.8. The patient received precautions, instructions and follow up and will be seen in the office 2 weeks following discharge having received discharge medications per AVS.    Dara Lords MD, 8:53 AM 02/26/2011

## 2011-02-26 NOTE — Telephone Encounter (Signed)
Patient called saying she needs me to fax letter to her employer ([employer] fax [phone]) stating she had surgery 02/21/11.  Letter faxed, copy is in e-chart and hardcopy chart. Pt. Informed. ka

## 2011-03-07 ENCOUNTER — Ambulatory Visit (INDEPENDENT_AMBULATORY_CARE_PROVIDER_SITE_OTHER): Payer: 59 | Admitting: Gynecology

## 2011-03-07 ENCOUNTER — Encounter: Payer: Self-pay | Admitting: Gynecology

## 2011-03-07 DIAGNOSIS — Z9889 Other specified postprocedural states: Secondary | ICD-10-CM

## 2011-03-07 NOTE — Patient Instructions (Signed)
Follow up in 2 weeks for your next postoperative check

## 2011-03-07 NOTE — Progress Notes (Signed)
Patient presents first postop visit status post [facility] A&P repair. She has done well without complaints and actually wants to go ahead and return to work.  Exam was Sherrilyn Rist chaperone present Abdomen soft nontender without masses guarding rebound organomegaly Pelvic external BUS vagina with incision sites intact and healing well. Bimanual without masses or tenderness. Vagina admits easily 2 fingers.  Assessment and plan: Postop status post [facility] A&P repair at two-week visit. Doing well. Slowly resume normal activities with the exception of pelvic rest. Will start back at half-day work and see me in 2 weeks and then we'll go from there. I did review with her that she is not sexually active at this time and whether we should treat her with estrogen to maintain vaginal health such as vaginal cream twice weekly or systemic ERT although she's not having significant other symptoms. My concern is that if she would go without intercourse or vaginal dilatation over a year or 2 that she may have atrophic changes or stenosis. We'll readdress this at her next postop appointment.

## 2011-03-21 ENCOUNTER — Ambulatory Visit: Payer: 59 | Admitting: Gynecology

## 2011-03-22 ENCOUNTER — Ambulatory Visit (INDEPENDENT_AMBULATORY_CARE_PROVIDER_SITE_OTHER): Payer: 59 | Admitting: Gynecology

## 2011-03-22 ENCOUNTER — Encounter: Payer: Self-pay | Admitting: Gynecology

## 2011-03-22 DIAGNOSIS — Z9889 Other specified postprocedural states: Secondary | ICD-10-CM

## 2011-03-22 NOTE — Progress Notes (Signed)
4 week postop check status post [facility] A&P repair doing well without complaints.   Exam with Sherrilyn Rist chaperone present Abdomen soft nontender without masses guarding rebound organomegaly. Pelvic external BUS vagina with incision sites healing nicely sutures in place. Bimanual without masses or tenderness. Good vaginal depth and width easily admits 2 fingers.  Assessment and plan: Four-week postop status post [facility] A&P repair doing well. Resume all activities with the exception of pelvic rest. Follow up post op check in 2 weeks.

## 2011-03-22 NOTE — Patient Instructions (Signed)
Follow up in 2 weeks for final postoperative checkup. May return to work. Continue nothing in the vagina until your next checkup.

## 2011-04-05 ENCOUNTER — Encounter: Payer: Self-pay | Admitting: Gynecology

## 2011-04-05 ENCOUNTER — Ambulatory Visit (INDEPENDENT_AMBULATORY_CARE_PROVIDER_SITE_OTHER): Payer: 59 | Admitting: Gynecology

## 2011-04-05 DIAGNOSIS — Z09 Encounter for follow-up examination after completed treatment for conditions other than malignant neoplasm: Secondary | ICD-10-CM

## 2011-04-05 NOTE — Progress Notes (Signed)
Postop checkup status post [facility] A&P repair 03/03/2011. Patient reports doing well no complaints.  Exam was Sherrilyn Rist chaperone present External BUS vagina well-healed. Bimanual without masses or tenderness.  Assessment and plan: Postop checkup status post [facility] A&P repair doing well. Will resume all normal activities and follow up in November 2013 for her annual exam.

## 2011-04-05 NOTE — Patient Instructions (Signed)
Follow up in November 2013 for annual exam.  Sooner if any problems.

## 2011-09-04 ENCOUNTER — Other Ambulatory Visit: Payer: Self-pay | Admitting: Gynecology

## 2011-09-04 DIAGNOSIS — Z1231 Encounter for screening mammogram for malignant neoplasm of breast: Secondary | ICD-10-CM

## 2011-09-25 ENCOUNTER — Ambulatory Visit
Admission: RE | Admit: 2011-09-25 | Discharge: 2011-09-25 | Disposition: A | Discharge status: Home / Self Care | Payer: 59 | Source: Ambulatory Visit | Attending: Gynecology | Admitting: Gynecology

## 2011-09-25 DIAGNOSIS — Z1231 Encounter for screening mammogram for malignant neoplasm of breast: Secondary | ICD-10-CM

## 2012-10-21 ENCOUNTER — Ambulatory Visit (INDEPENDENT_AMBULATORY_CARE_PROVIDER_SITE_OTHER): Payer: BC Managed Care – PPO | Admitting: Family Medicine

## 2012-10-21 ENCOUNTER — Encounter: Payer: Self-pay | Admitting: Family Medicine

## 2012-10-21 ENCOUNTER — Encounter: Payer: Self-pay | Admitting: *Deleted

## 2012-10-21 VITALS — BP 132/78 | HR 74 | Temp 98.5°F | Ht 62.0 in | Wt 140.8 lb

## 2012-10-21 DIAGNOSIS — R002 Palpitations: Secondary | ICD-10-CM

## 2012-10-21 LAB — CBC WITH DIFFERENTIAL/PLATELET
Basophils Relative: 0.3 % (ref 0.0–3.0)
Eosinophils Absolute: 0.5 10*3/uL (ref 0.0–0.7)
Eosinophils Relative: 5 % (ref 0.0–5.0)
HCT: 45.4 % (ref 36.0–46.0)
Hemoglobin: 15.4 g/dL — ABNORMAL HIGH (ref 12.0–15.0)
MCHC: 34 g/dL (ref 30.0–36.0)
MCV: 93.4 fl (ref 78.0–100.0)
Monocytes Absolute: 0.5 10*3/uL (ref 0.1–1.0)
Neutro Abs: 6.1 10*3/uL (ref 1.4–7.7)
RBC: 4.86 Mil/uL (ref 3.87–5.11)
WBC: 10.6 10*3/uL — ABNORMAL HIGH (ref 4.5–10.5)

## 2012-10-21 LAB — BASIC METABOLIC PANEL
CO2: 28 mEq/L (ref 19–32)
Chloride: 107 mEq/L (ref 96–112)
Potassium: 5.1 mEq/L (ref 3.5–5.1)
Sodium: 140 mEq/L (ref 135–145)

## 2012-10-21 MED ORDER — ASPIRIN 81 MG PO TABS: 81.0000 mg | ORAL_TABLET | Freq: Every day | ORAL | Status: DC

## 2012-10-21 MED ORDER — METOPROLOL TARTRATE 25 MG PO TABS: 25.0000 mg | ORAL_TABLET | Freq: Two times a day (BID) | ORAL | Status: DC

## 2012-10-21 NOTE — Patient Instructions (Signed)
We'll contact you with your lab report. Shirlee Limerick will call about your referral. Start taking metoprolol twice a day and aspirin once a day.  If you have severe chest pain or palpitations that don't resolve then to go the ER.  Take care.

## 2012-10-21 NOTE — Progress Notes (Signed)
Hot flashes started with menopause, she would have occ fluttering in her chest.  2 weeks ago, she had an episodes of heart racing that was much longer, lasted 40 min.  Felt pounding her chest.  H/o panic and anxiety, but this felt different.  No recalled CP.  Since then, has had mult episodes with fatigue and nausea, possible fluttering but not like the episode.  This AM was taking son to school and she felt a fluttering in her chest, about 5.5 hours ago.  She felt presyncopal but didn't pass out.  Both eyes got blurry.  This was brief, only a few seconds. It self resolved. No sx like that again.  She had some atypical R sided chest pain at rest with the episode this AM, this episode was at rest.  Not on ASA daily.  No CP now.  She hasn't had sx like this AM prev.  No trigger that was different this AM.  She does smoke and drink about 4 cups of coffee a day.  She hasn't smoked since this AM.  No exertional CP.   PMH and SH reviewed  ROS: See HPI, otherwise noncontributory.  Meds, vitals, and allergies reviewed.   GEN: nad, alert and oriented HEENT: mucous membranes moist NECK: supple w/o LA CV: rrr.  no murmur PULM: ctab, no inc wob ABD: soft, +bs EXT: no edema SKIN: no acute rash

## 2012-10-22 ENCOUNTER — Encounter: Payer: Self-pay | Admitting: Family Medicine

## 2012-10-22 DIAGNOSIS — R002 Palpitations: Secondary | ICD-10-CM | POA: Insufficient documentation

## 2012-10-22 NOTE — Assessment & Plan Note (Signed)
See notes on labs, start BB and refer to cards.  Mildly short PR noted.  O/w EGD wnl. D/w pt.  Okay for outpatient f/u.  She's going to stop smoking.  Taper off caffeine.  She agrees.

## 2012-10-28 ENCOUNTER — Ambulatory Visit: Payer: 59 | Admitting: Family Medicine

## 2012-10-28 ENCOUNTER — Encounter: Payer: Self-pay | Admitting: *Deleted

## 2012-11-05 ENCOUNTER — Encounter: Payer: Self-pay | Admitting: *Deleted

## 2012-11-05 ENCOUNTER — Encounter: Payer: Self-pay | Admitting: Cardiology

## 2012-11-05 ENCOUNTER — Ambulatory Visit (INDEPENDENT_AMBULATORY_CARE_PROVIDER_SITE_OTHER): Payer: BC Managed Care – PPO | Admitting: Cardiology

## 2012-11-05 VITALS — BP 124/82 | HR 70 | Ht 62.0 in | Wt 143.4 lb

## 2012-11-05 DIAGNOSIS — R55 Syncope and collapse: Secondary | ICD-10-CM | POA: Insufficient documentation

## 2012-11-05 DIAGNOSIS — R0789 Other chest pain: Secondary | ICD-10-CM | POA: Insufficient documentation

## 2012-11-05 DIAGNOSIS — R079 Chest pain, unspecified: Secondary | ICD-10-CM

## 2012-11-05 DIAGNOSIS — R002 Palpitations: Secondary | ICD-10-CM

## 2012-11-05 LAB — LDL CHOLESTEROL, DIRECT: Direct LDL: 191.2 mg/dL

## 2012-11-05 LAB — LIPID PANEL
Cholesterol: 260 mg/dL — ABNORMAL HIGH (ref 0–200)
HDL: 37.8 mg/dL — ABNORMAL LOW (ref >39.00)
Total CHOL/HDL Ratio: 7
Triglycerides: 186 mg/dL — ABNORMAL HIGH (ref 0.0–149.0)
VLDL: 37.2 mg/dL (ref 0.0–40.0)

## 2012-11-05 NOTE — Patient Instructions (Addendum)
Your physician recommends that [facility] lab work today: Lipid profile  Your physician has recommended that you wear an event monitor. Event monitors are medical devices that record the heart's electrical activity. Doctors most often [place] these monitors to diagnose arrhythmias. Arrhythmias are problems with the speed or rhythm of the heartbeat. The monitor is a small, portable device. You can wear one while you do your normal daily activities. This is usually used to diagnose what is causing palpitations/syncope (passing out).  Your physician has requested that you have an echocardiogram. Echocardiography is a painless test that uses sound waves to create images of your heart. It provides your doctor with information about the size and shape of your heart and how well your heart's chambers and valves are working. This procedure takes approximately one hour. There are no restrictions for this procedure.  Your physician has requested that you have a stress echocardiogram. For further information please visit [email or web address] Please follow instruction sheet as given.  Your physician recommends that you continue on your current medications as directed. Please refer to the Current Medication list given to you today.     --------The echo & stress echo needs to be done the same day per Dr. Delton See-------
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
## 2012-11-07 ENCOUNTER — Telehealth: Payer: Self-pay | Admitting: *Deleted

## 2012-11-07 ENCOUNTER — Encounter (INDEPENDENT_AMBULATORY_CARE_PROVIDER_SITE_OTHER): Payer: BC Managed Care – PPO

## 2012-11-07 DIAGNOSIS — R002 Palpitations: Secondary | ICD-10-CM

## 2012-11-07 NOTE — Telephone Encounter (Signed)
21 day event monitor placed on Pt 11/07/12 TK

## 2012-11-10 ENCOUNTER — Other Ambulatory Visit: Payer: Self-pay

## 2012-11-10 ENCOUNTER — Telehealth: Payer: Self-pay | Admitting: Cardiology

## 2012-11-10 MED ORDER — ATORVASTATIN CALCIUM 40 MG PO TABS: 40.0000 mg | ORAL_TABLET | Freq: Every day | ORAL | Status: DC

## 2012-11-10 NOTE — Telephone Encounter (Signed)
11-10-12 903am pt rtn call

## 2012-11-10 NOTE — Telephone Encounter (Signed)
Pt given lab results, she verbalized understanding. 

## 2012-11-20 ENCOUNTER — Other Ambulatory Visit (HOSPITAL_COMMUNITY): Payer: Self-pay | Admitting: Cardiology

## 2012-11-20 DIAGNOSIS — R079 Chest pain, unspecified: Secondary | ICD-10-CM

## 2012-11-21 ENCOUNTER — Other Ambulatory Visit (HOSPITAL_COMMUNITY): Payer: Self-pay | Admitting: Cardiology

## 2012-11-21 ENCOUNTER — Ambulatory Visit (HOSPITAL_BASED_OUTPATIENT_CLINIC_OR_DEPARTMENT_OTHER): Payer: BC Managed Care – PPO

## 2012-11-21 ENCOUNTER — Ambulatory Visit (HOSPITAL_COMMUNITY): Payer: BC Managed Care – PPO | Attending: Internal Medicine | Admitting: Cardiology

## 2012-11-21 DIAGNOSIS — E785 Hyperlipidemia, unspecified: Secondary | ICD-10-CM | POA: Insufficient documentation

## 2012-11-21 DIAGNOSIS — Z8249 Family history of ischemic heart disease and other diseases of the circulatory system: Secondary | ICD-10-CM | POA: Insufficient documentation

## 2012-11-21 DIAGNOSIS — R002 Palpitations: Secondary | ICD-10-CM | POA: Insufficient documentation

## 2012-11-21 DIAGNOSIS — R0989 Other specified symptoms and signs involving the circulatory and respiratory systems: Secondary | ICD-10-CM | POA: Insufficient documentation

## 2012-11-21 DIAGNOSIS — R0609 Other forms of dyspnea: Secondary | ICD-10-CM | POA: Insufficient documentation

## 2012-11-21 DIAGNOSIS — F17200 Nicotine dependence, unspecified, uncomplicated: Secondary | ICD-10-CM | POA: Insufficient documentation

## 2012-11-21 DIAGNOSIS — R0602 Shortness of breath: Secondary | ICD-10-CM

## 2012-11-21 DIAGNOSIS — R079 Chest pain, unspecified: Secondary | ICD-10-CM

## 2012-11-21 DIAGNOSIS — N189 Chronic kidney disease, unspecified: Secondary | ICD-10-CM | POA: Insufficient documentation

## 2012-11-21 DIAGNOSIS — R55 Syncope and collapse: Secondary | ICD-10-CM

## 2012-11-21 NOTE — Progress Notes (Signed)
Echo performed. 

## 2012-11-21 NOTE — Progress Notes (Signed)
Stress echocardiogram performed. 

## 2012-12-05 ENCOUNTER — Telehealth: Payer: Self-pay

## 2012-12-05 NOTE — Telephone Encounter (Signed)
**Note De-Identified Baran Kuhrt Obfuscation** Per Dr Delton See

## 2012-12-05 NOTE — Telephone Encounter (Signed)
Follow Up ° °Pt returned call//  °

## 2012-12-05 NOTE — Telephone Encounter (Signed)
Follow Up: ° °Pt states she is returning Lynn's call.  °

## 2012-12-05 NOTE — Telephone Encounter (Signed)
**Note De-identified [name] Obfuscation** LMTCB

## 2012-12-05 NOTE — Telephone Encounter (Signed)
Per Dr Delton See the pt is advised that the event monitor that she wore showed 1 episode of very short arrhythmia . Atrial fibrillation-a total of 5 beats. There is no need for medication change or future intervention at this time. The pt verbalized understanding.

## 2013-02-06 ENCOUNTER — Ambulatory Visit: Payer: BC Managed Care – PPO | Admitting: Family Medicine

## 2013-02-06 DIAGNOSIS — Z0289 Encounter for other administrative examinations: Secondary | ICD-10-CM

## 2013-02-24 ENCOUNTER — Encounter: Payer: Self-pay | Admitting: Internal Medicine

## 2013-02-24 ENCOUNTER — Ambulatory Visit (INDEPENDENT_AMBULATORY_CARE_PROVIDER_SITE_OTHER): Payer: BC Managed Care – PPO | Admitting: Internal Medicine

## 2013-02-24 VITALS — BP 118/78 | HR 109 | Temp 98.8°F | Wt 144.0 lb

## 2013-02-24 DIAGNOSIS — R142 Eructation: Secondary | ICD-10-CM

## 2013-02-24 DIAGNOSIS — R110 Nausea: Secondary | ICD-10-CM

## 2013-02-24 DIAGNOSIS — R141 Gas pain: Secondary | ICD-10-CM

## 2013-02-24 DIAGNOSIS — R1011 Right upper quadrant pain: Secondary | ICD-10-CM

## 2013-02-24 DIAGNOSIS — R143 Flatulence: Secondary | ICD-10-CM

## 2013-02-24 DIAGNOSIS — R140 Abdominal distension (gaseous): Secondary | ICD-10-CM

## 2013-02-24 LAB — COMPREHENSIVE METABOLIC PANEL
ALBUMIN: 3.8 g/dL (ref 3.5–5.2)
ALT: 14 U/L (ref 0–35)
AST: 16 U/L (ref 0–37)
Alkaline Phosphatase: 89 U/L (ref 39–117)
BUN: 8 mg/dL (ref 6–23)
CALCIUM: 9.3 mg/dL (ref 8.4–10.5)
CHLORIDE: 102 meq/L (ref 96–112)
CO2: 28 mEq/L (ref 19–32)
CREATININE: 0.9 mg/dL (ref 0.4–1.2)
GFR: 68.01 mL/min (ref >60.00)
GLUCOSE: 120 mg/dL — AB (ref 70–99)
POTASSIUM: 4.3 meq/L (ref 3.5–5.1)
Sodium: 137 mEq/L (ref 135–145)
Total Bilirubin: 0.7 mg/dL (ref 0.3–1.2)
Total Protein: 7.8 g/dL (ref 6.0–8.3)

## 2013-02-24 LAB — LIPASE: LIPASE: 19 U/L (ref 11.0–59.0)

## 2013-02-24 LAB — AMYLASE: Amylase: 42 U/L (ref 27–131)

## 2013-02-24 MED ORDER — TRAMADOL HCL 50 MG PO TABS: 50.0000 mg | ORAL_TABLET | Freq: Three times a day (TID) | ORAL | Status: DC | PRN

## 2013-02-24 MED ORDER — ONDANSETRON HCL 4 MG PO TABS: 4.0000 mg | ORAL_TABLET | Freq: Three times a day (TID) | ORAL | Status: DC | PRN

## 2013-02-24 NOTE — Progress Notes (Signed)
Pre-visit discussion using our clinic review tool. No additional management support is needed unless otherwise documented below in the visit note.  
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
## 2013-02-24 NOTE — Patient Instructions (Signed)
Abdominal Pain, Adult °Many things can cause abdominal pain. Usually, abdominal pain is not caused by a disease and will improve without treatment. It can often be observed and treated at home. Your health care provider will do a physical exam and possibly order blood tests and X-rays to help determine the seriousness of your pain. However, in many cases, more time must pass before a clear cause of the pain can be found. Before that point, your health care provider may not know if you need more testing or further treatment. °HOME CARE INSTRUCTIONS  °Monitor your abdominal pain for any changes. The following actions may help to alleviate any discomfort you are experiencing: °· Only take over-the-counter or prescription medicines as directed by your health care provider. °· Do not take laxatives unless directed to do so by your health care provider. °· Try a clear liquid diet (broth, tea, or water) as directed by your health care provider. Slowly move to a bland diet as tolerated. °SEEK MEDICAL CARE IF: °· You have unexplained abdominal pain. °· You have abdominal pain associated with nausea or diarrhea. °· You have pain when you urinate or have a bowel movement. °· You experience abdominal pain that wakes you in the night. °· You have abdominal pain that is worsened or improved by eating food. °· You have abdominal pain that is worsened with eating fatty foods. °SEEK IMMEDIATE MEDICAL CARE IF:  °· Your pain does not go away within 2 hours. °· You have a fever. °· You keep throwing up (vomiting). °· Your pain is felt only in portions of the abdomen, such as the right side or the left lower portion of the abdomen. °· You pass bloody or black tarry stools. °MAKE SURE YOU: °· Understand these instructions.   °· Will watch your condition.   °· Will get help right away if you are not doing well or get worse.   °Document Released: 11/08/2004 Document Revised: 11/19/2012 Document Reviewed: 10/08/2012 °Exit[employer]® Patient  Information ©2014 [employer]. ° °

## 2013-02-26 ENCOUNTER — Ambulatory Visit
Admission: RE | Admit: 2013-02-26 | Discharge: 2013-02-26 | Disposition: A | Discharge status: Home / Self Care | Payer: BC Managed Care – PPO | Source: Ambulatory Visit | Attending: Internal Medicine | Admitting: Internal Medicine

## 2013-02-26 DIAGNOSIS — R1011 Right upper quadrant pain: Secondary | ICD-10-CM

## 2013-03-14 ENCOUNTER — Telehealth: Payer: Self-pay | Admitting: Family Medicine

## 2013-03-14 NOTE — Telephone Encounter (Signed)
Relevant patient education mailed to patient.  

## 2013-03-20 ENCOUNTER — Ambulatory Visit (INDEPENDENT_AMBULATORY_CARE_PROVIDER_SITE_OTHER): Payer: BC Managed Care – PPO | Admitting: Family Medicine

## 2013-03-20 ENCOUNTER — Encounter: Payer: Self-pay | Admitting: Family Medicine

## 2013-03-20 VITALS — BP 120/78 | HR 88 | Temp 98.0°F | Wt 138.0 lb

## 2013-03-20 DIAGNOSIS — R1011 Right upper quadrant pain: Secondary | ICD-10-CM

## 2013-03-20 NOTE — Patient Instructions (Signed)
Shirlee LimerickMarion will call about your referral. Low fat diet in the meantime.  If you have sudden increase in pain, then go to the ER.  Take care.

## 2013-03-20 NOTE — Progress Notes (Signed)
Pre-visit discussion using our clinic review tool. No additional management support is needed unless otherwise documented below in the visit note.  Prev seen with intermittent RUQ pain. U/s done.  Patient was told no stones, I think this was referring to no stones in seen in the CBD.  She did have an possible abnormality on the GB.  In meantime, on low fat diet and less sx, but occ twinges/sensations in the RUQ.  No other abd pain.  No fevers, no diarrhea, no vomiting.  Pain isn't constant.  Asking for advice.    Meds, vitals, and allergies reviewed.   ROS: See HPI.  Otherwise, noncontributory.  GEN: nad, alert and oriented HEENT: mucous membranes moist NECK: supple w/o LA CV: rrr. PULM: ctab, no inc wob ABD: soft, +bs, not ttp, no rebound EXT: no edema SKIN: no acute rash

## 2013-03-22 DIAGNOSIS — R1011 Right upper quadrant pain: Secondary | ICD-10-CM | POA: Insufficient documentation

## 2013-03-22 NOTE — Assessment & Plan Note (Signed)
Labs and imaging, anatomy d/w pt.  Refer to gen surg for consideration of chole.  Defer HIDA to surgery, if needed.  She agrees.  Continue low fat diet for now.  Okay for outpatient f/u.  If profound pain, then to ER.  She agrees.

## 2013-03-23 ENCOUNTER — Telehealth: Payer: Self-pay | Admitting: Family Medicine

## 2013-03-23 NOTE — Telephone Encounter (Signed)
Relevant patient education mailed to patient.  

## 2013-03-30 ENCOUNTER — Telehealth (INDEPENDENT_AMBULATORY_CARE_PROVIDER_SITE_OTHER): Payer: Self-pay | Admitting: General Surgery

## 2013-03-30 NOTE — Telephone Encounter (Signed)
LMOM for patient to call back and ask for Rafia Shedden 

## 2013-04-01 ENCOUNTER — Ambulatory Visit (INDEPENDENT_AMBULATORY_CARE_PROVIDER_SITE_OTHER): Payer: BC Managed Care – PPO | Admitting: Surgery

## 2013-04-03 ENCOUNTER — Encounter (INDEPENDENT_AMBULATORY_CARE_PROVIDER_SITE_OTHER): Payer: Self-pay

## 2013-04-03 ENCOUNTER — Encounter (INDEPENDENT_AMBULATORY_CARE_PROVIDER_SITE_OTHER): Payer: Self-pay | Admitting: Surgery

## 2013-04-03 ENCOUNTER — Ambulatory Visit (INDEPENDENT_AMBULATORY_CARE_PROVIDER_SITE_OTHER): Payer: BC Managed Care – PPO | Admitting: Surgery

## 2013-04-03 VITALS — BP 110/64 | HR 70 | Resp 16 | Ht 62.0 in | Wt 135.0 lb

## 2013-04-03 DIAGNOSIS — K8010 Calculus of gallbladder with chronic cholecystitis without obstruction: Secondary | ICD-10-CM | POA: Insufficient documentation
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
## 2013-04-29 ENCOUNTER — Telehealth: Payer: Self-pay

## 2013-04-29 ENCOUNTER — Ambulatory Visit (INDEPENDENT_AMBULATORY_CARE_PROVIDER_SITE_OTHER)
Admission: RE | Admit: 2013-04-29 | Discharge: 2013-04-29 | Disposition: A | Discharge status: Home / Self Care | Payer: BC Managed Care – PPO | Source: Ambulatory Visit | Attending: Family Medicine | Admitting: Family Medicine

## 2013-04-29 DIAGNOSIS — Z209 Contact with and (suspected) exposure to unspecified communicable disease: Secondary | ICD-10-CM

## 2013-04-29 NOTE — Telephone Encounter (Signed)
Pt left v/m; pt was scheduled to have gallbladder surgery today but was cancelled due to pts chart indicating pt had tested positive for TB 21 years ago. Pt cannot have surgery rescheduled until has chest xray. Pt request cb.

## 2013-04-29 NOTE — Telephone Encounter (Signed)
Patient notified as instructed by telephone. Patient will come for xray today before 6:00.

## 2013-04-29 NOTE — Telephone Encounter (Signed)
Order is in for CXR here.  Please get a lab visit scheduled to do the CXR.

## 2013-04-30 ENCOUNTER — Telehealth (INDEPENDENT_AMBULATORY_CARE_PROVIDER_SITE_OTHER): Payer: Self-pay | Admitting: General Surgery

## 2013-04-30 ENCOUNTER — Telehealth (INDEPENDENT_AMBULATORY_CARE_PROVIDER_SITE_OTHER): Payer: Self-pay | Admitting: Surgery

## 2013-04-30 NOTE — Telephone Encounter (Signed)
She was cancelled by anesthesia 3/18 stated xray was fine wanted to be rescheduled wants before April 1 when insurance starts over

## 2013-04-30 NOTE — Telephone Encounter (Signed)
Faxed results to the [facility] to the Attn: Anesthesia and I also called and told them to give it to an Anesthesia. I faxed to the number [phone]

## 2013-05-08 ENCOUNTER — Other Ambulatory Visit (INDEPENDENT_AMBULATORY_CARE_PROVIDER_SITE_OTHER): Payer: Self-pay | Admitting: Surgery

## 2013-05-08 ENCOUNTER — Other Ambulatory Visit (INDEPENDENT_AMBULATORY_CARE_PROVIDER_SITE_OTHER): Payer: Self-pay | Admitting: *Deleted

## 2013-05-08 DIAGNOSIS — K8010 Calculus of gallbladder with chronic cholecystitis without obstruction: Secondary | ICD-10-CM

## 2013-05-08 MED ORDER — OXYCODONE-ACETAMINOPHEN 5-325 MG PO TABS: 1.0000 | ORAL_TABLET | ORAL | Status: DC | PRN

## 2013-05-11 ENCOUNTER — Telehealth (INDEPENDENT_AMBULATORY_CARE_PROVIDER_SITE_OTHER): Payer: Self-pay | Admitting: *Deleted

## 2013-05-11 ENCOUNTER — Encounter (INDEPENDENT_AMBULATORY_CARE_PROVIDER_SITE_OTHER): Payer: Self-pay | Admitting: General Surgery

## 2013-05-11 NOTE — Telephone Encounter (Signed)
LMOM for patient to call back and [facility] for [facility]. I will give her an note for work and apt to see Dr Corliss Skainssuei for an follow up from gallbladder surgery on 05-08-13

## 2013-05-11 NOTE — Telephone Encounter (Signed)
Pt called asking when she would be able to return to work.  She just had Gallbladder Surgery 3.27.15. I advised it could be 1-2 weeks, depending on the pt.  She does not have a post op appt scheduled as of today and she states she needs to schedule that..  Please advise.Marland Kitchen. JW

## 2013-05-11 NOTE — Telephone Encounter (Signed)
Patient called back and she got an apt with Dr Corliss Skainssuei and RTW note also

## 2013-05-14 ENCOUNTER — Encounter (INDEPENDENT_AMBULATORY_CARE_PROVIDER_SITE_OTHER): Payer: BC Managed Care – PPO | Admitting: Surgery

## 2013-06-02 ENCOUNTER — Encounter (INDEPENDENT_AMBULATORY_CARE_PROVIDER_SITE_OTHER): Payer: Self-pay | Admitting: Surgery

## 2013-06-02 ENCOUNTER — Ambulatory Visit (INDEPENDENT_AMBULATORY_CARE_PROVIDER_SITE_OTHER): Payer: BC Managed Care – PPO | Admitting: Surgery

## 2013-06-02 VITALS — BP 116/78 | HR 73 | Temp 97.2°F | Ht 62.0 in | Wt 140.4 lb

## 2013-06-02 DIAGNOSIS — K8010 Calculus of gallbladder with chronic cholecystitis without obstruction: Secondary | ICD-10-CM

## 2013-06-02 NOTE — Progress Notes (Signed)
Status post left upper cholecystectomy with cholangiogram on 05/08/13 for chronic cholecystitis. The patient is doing quite well. Appetite and bowel movements are normal. Her incisions are well-healed with no sign of infection. She may resume flow to be a regular diet. Followup when necessary.  Wilmon ArmsMatthew K. Corliss Skainssuei, MD, [facility]  General/ Trauma Surgery  06/02/2013 3:43 PM

## 2013-08-17 ENCOUNTER — Ambulatory Visit (INDEPENDENT_AMBULATORY_CARE_PROVIDER_SITE_OTHER): Payer: BC Managed Care – PPO | Admitting: Family Medicine

## 2013-08-17 ENCOUNTER — Encounter: Payer: Self-pay | Admitting: Family Medicine

## 2013-08-17 VITALS — BP 116/74 | HR 91 | Temp 98.2°F | Wt 149.5 lb

## 2013-08-17 DIAGNOSIS — M25579 Pain in unspecified ankle and joints of unspecified foot: Secondary | ICD-10-CM

## 2013-08-17 DIAGNOSIS — F17200 Nicotine dependence, unspecified, uncomplicated: Secondary | ICD-10-CM

## 2013-08-17 DIAGNOSIS — R0602 Shortness of breath: Secondary | ICD-10-CM

## 2013-08-17 MED ORDER — CEFTRIAXONE SODIUM 1 G IJ SOLR
1.0000 g | Freq: Once | INTRAMUSCULAR | Status: AC
  Administered 2013-08-17: 1 g via INTRAMUSCULAR

## 2013-08-17 NOTE — Patient Instructions (Addendum)
Look at the patch and the flavored gum vs lozenges.   Use your ankle sleeve and wear boots for now.  Avoid sandals.  Keep using ice as needed.  If not better, get a lace up ankle ASO.   This looks like a strain. Take care.

## 2013-08-17 NOTE — Progress Notes (Signed)
Pre visit review using our clinic review tool, if applicable. No additional management support is needed unless otherwise documented below in the visit note.  L ankle swelling. H/o fracture years ago.  Ankle occ feels weaker, if wearing sandals after wearing supportive boots, but that resolves on its own.  No swelling until recently, the last week.  No trauma.  She did go hiking recently, right before the swelling started.  Not much pain, some occ discomfort. No bruising.  No popping or clicking.  Was wearing boots when hiking, about [place]5 [place].  No R sided sx.  She used ibuprofen and ice in the meantime.    Discussed smoking cessation. Not interested in chantix.  Discussed options.  Has a quit date, her birthday.  1 PPD.   Meds, vitals, and allergies reviewed.   ROS: See HPI.  Otherwise, noncontributory.  nad L ankle with slightly puffiness, no bruising or erythema.  Normal DP pulse and not ttp over bony prominences.   Slightly tender inferior to medial malleolus, pain with single leg heel raises Pain with resisted plantarflexion.  Distally NV intact

## 2013-08-18 DIAGNOSIS — M25579 Pain in unspecified ankle and joints of unspecified foot: Secondary | ICD-10-CM | POA: Insufficient documentation

## 2013-08-18 DIAGNOSIS — F17200 Nicotine dependence, unspecified, uncomplicated: Secondary | ICD-10-CM | POA: Insufficient documentation

## 2013-08-18 NOTE — Assessment & Plan Note (Signed)
Discussed smoking cessation. Not interested in chantix.  Discussed options.  Has a quit date, her birthday.  1 PPD. She'll consider OTC options.  See instructions.

## 2013-08-18 NOTE — Assessment & Plan Note (Signed)
Likely strain/tendonitis with need for support, can use boots vs ASO.  dw pt.  Gradually wean out of support and work on ROM exercise.  RICE in meantime.

## 2013-11-02 ENCOUNTER — Ambulatory Visit (INDEPENDENT_AMBULATORY_CARE_PROVIDER_SITE_OTHER): Payer: BC Managed Care – PPO | Admitting: Internal Medicine

## 2013-11-02 ENCOUNTER — Encounter: Payer: Self-pay | Admitting: Internal Medicine

## 2013-11-02 VITALS — BP 120/76 | HR 94 | Temp 98.8°F | Wt 150.0 lb

## 2013-11-02 DIAGNOSIS — J069 Acute upper respiratory infection, unspecified: Secondary | ICD-10-CM

## 2013-11-02 DIAGNOSIS — J309 Allergic rhinitis, unspecified: Secondary | ICD-10-CM

## 2013-11-02 DIAGNOSIS — B9789 Other viral agents as the cause of diseases classified elsewhere: Principal | ICD-10-CM

## 2013-11-02 MED ORDER — BENZONATATE 200 MG PO CAPS: 200.0000 mg | ORAL_CAPSULE | Freq: Two times a day (BID) | ORAL | Status: DC | PRN
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
## 2013-11-02 NOTE — Progress Notes (Signed)
Pre visit review using our clinic review tool, if applicable. No additional management support is needed unless otherwise documented below in the visit note. 

## 2013-11-02 NOTE — Patient Instructions (Signed)
Upper Respiratory Infection, Adult An upper respiratory infection (URI) is also sometimes known as the common cold. The upper respiratory tract includes the nose, sinuses, throat, trachea, and bronchi. Bronchi are the airways leading to the lungs. Most people improve within 1 week, but symptoms can last up to 2 weeks. A residual cough may last even longer.  CAUSES Many different viruses can infect the tissues lining the upper respiratory tract. The tissues become irritated and inflamed and often become very moist. Mucus production is also common. A cold is contagious. You can easily spread the virus to others by oral contact. This includes kissing, sharing a glass, coughing, or sneezing. Touching your mouth or nose and then touching a surface, which is then touched by another person, can also spread the virus. SYMPTOMS  Symptoms typically develop 1 to 3 days after you come in contact with a cold virus. Symptoms vary from person to person. They may include:  Runny nose.  Sneezing.  Nasal congestion.  Sinus irritation.  Sore throat.  Loss of voice (laryngitis).  Cough.  Fatigue.  Muscle aches.  Loss of appetite.  Headache.  Low-grade fever. DIAGNOSIS  You might diagnose your own cold based on familiar symptoms, since most people get a cold 2 to 3 times a year. Your caregiver can confirm this based on your exam. Most importantly, your caregiver can check that your symptoms are not due to another disease such as strep throat, sinusitis, pneumonia, asthma, or epiglottitis. Blood tests, throat tests, and X-rays are not necessary to diagnose a common cold, but they may sometimes be helpful in excluding other more serious diseases. Your caregiver will decide if any further tests are required. RISKS AND COMPLICATIONS  You may be at risk for a more severe case of the common cold if you smoke cigarettes, have chronic heart disease (such as heart failure) or lung disease (such as asthma), or if  you have a weakened immune system. The very young and very old are also at risk for more serious infections. Bacterial sinusitis, middle ear infections, and bacterial pneumonia can complicate the common cold. The common cold can worsen asthma and chronic obstructive pulmonary disease (COPD). Sometimes, these complications can require emergency medical care and may be life-threatening. PREVENTION  The best way to protect against getting a cold is to practice good hygiene. Avoid oral or hand contact with people with cold symptoms. Wash your hands often if contact occurs. There is no clear evidence that vitamin C, vitamin E, echinacea, or exercise reduces the chance of developing a cold. However, it is always recommended to get plenty of rest and practice good nutrition. TREATMENT  Treatment is directed at relieving symptoms. There is no cure. Antibiotics are not effective, because the infection is caused by a virus, not by bacteria. Treatment may include:  Increased fluid intake. Sports drinks offer valuable electrolytes, sugars, and fluids.  Breathing heated mist or steam (vaporizer or shower).  Eating chicken soup or other clear broths, and maintaining good nutrition.  Getting plenty of rest.  Using gargles or lozenges for comfort.  Controlling fevers with ibuprofen or acetaminophen as directed by your caregiver.  Increasing usage of your inhaler if you have asthma. Zinc gel and zinc lozenges, taken in the first 24 hours of the common cold, can shorten the duration and lessen the severity of symptoms. Pain medicines may help with fever, muscle aches, and throat pain. A variety of non-prescription medicines are available to treat congestion and runny nose. Your caregiver   can make recommendations and may suggest nasal or lung inhalers for other symptoms.  HOME CARE INSTRUCTIONS   Only take over-the-counter or prescription medicines for pain, discomfort, or fever as directed by your  caregiver.  Use a warm mist humidifier or inhale steam from a shower to increase air moisture. This may keep secretions moist and make it easier to breathe.  Drink enough water and fluids to keep your urine clear or pale yellow.  Rest as needed.  Return to work when your temperature has returned to normal or as your caregiver advises. You may need to stay home longer to avoid infecting others. You can also use a face mask and careful hand washing to prevent spread of the virus. SEEK MEDICAL CARE IF:   After the first few days, you feel you are getting worse rather than better.  You need your caregiver's advice about medicines to control symptoms.  You develop chills, worsening shortness of breath, or brown or red sputum. These may be signs of pneumonia.  You develop yellow or brown nasal discharge or pain in the face, especially when you bend forward. These may be signs of sinusitis.  You develop a fever, swollen neck glands, pain with swallowing, or white areas in the back of your throat. These may be signs of strep throat. SEEK IMMEDIATE MEDICAL CARE IF:   You have a fever.  You develop severe or persistent headache, ear pain, sinus pain, or chest pain.  You develop wheezing, a prolonged cough, cough up blood, or have a change in your usual mucus (if you have chronic lung disease).  You develop sore muscles or a stiff neck. Document Released: 07/25/2000 Document Revised: 04/23/2011 Document Reviewed: 05/06/2013 Exit[employer] Patient Information 2015 [employer]. This information is not intended to replace advice given to you by your health care provider. Make sure you discuss any questions you have with your health care provider.  

## 2013-12-07 ENCOUNTER — Other Ambulatory Visit: Payer: Self-pay

## 2013-12-07 DIAGNOSIS — Z1231 Encounter for screening mammogram for malignant neoplasm of breast: Secondary | ICD-10-CM

## 2013-12-14 ENCOUNTER — Encounter: Payer: Self-pay | Admitting: Internal Medicine

## 2013-12-16 ENCOUNTER — Ambulatory Visit
Admission: RE | Admit: 2013-12-16 | Discharge: 2013-12-16 | Disposition: A | Discharge status: Home / Self Care | Payer: BC Managed Care – PPO | Source: Ambulatory Visit

## 2013-12-16 DIAGNOSIS — Z1231 Encounter for screening mammogram for malignant neoplasm of breast: Secondary | ICD-10-CM

## 2014-01-20 ENCOUNTER — Encounter: Payer: Self-pay | Admitting: Women's Health

## 2014-02-19 ENCOUNTER — Encounter: Payer: Self-pay | Admitting: Family Medicine

## 2014-02-19 ENCOUNTER — Ambulatory Visit (INDEPENDENT_AMBULATORY_CARE_PROVIDER_SITE_OTHER): Payer: BLUE CROSS/BLUE SHIELD | Admitting: Family Medicine

## 2014-02-19 VITALS — BP 116/76 | HR 96 | Temp 98.6°F | Wt 151.5 lb

## 2014-02-19 DIAGNOSIS — J0100 Acute maxillary sinusitis, unspecified: Secondary | ICD-10-CM

## 2014-02-19 MED ORDER — AMOXICILLIN-POT CLAVULANATE 875-125 MG PO TABS: 1.0000 | ORAL_TABLET | Freq: Two times a day (BID) | ORAL | Status: DC

## 2014-02-19 NOTE — Patient Instructions (Signed)
Start the antibiotics today.  Drink plenty of fluids, take tylenol as needed, and gargle with warm salt water for your throat.  This should gradually improve.  Take care.  Let us know if you have other concerns.

## 2014-02-19 NOTE — Progress Notes (Signed)
Pre visit review using our clinic review tool, if applicable. No additional management support is needed unless otherwise documented below in the visit note.  Sx started about 1 week ago.  Recently she was feverish, had chills, less so this AM.  Facial pressure.  L maxillary pain.  No ST.  No ear pain.  Some rhinorrhea, stuffy.  Some cough.  Smoker, cutting back some.  She had some aches prev.  She needed a work note.   Meds, vitals, and allergies reviewed.   ROS: See HPI.  Otherwise, noncontributory.  GEN: nad, alert and oriented HEENT: mucous membranes moist, tm w/o erythema, nasal exam w/o erythema, clear discharge noted,  OP with cobblestoning, L max sinus slightly ttp NECK: supple w/o LA CV: rrr.   PULM: ctab, no inc wob EXT: no edema

## 2014-02-21 DIAGNOSIS — J019 Acute sinusitis, unspecified: Secondary | ICD-10-CM | POA: Insufficient documentation

## 2014-02-21 NOTE — Assessment & Plan Note (Signed)
Nontoxicc, start augmentin, f/u prn.  D/w pt, agreed.

## 2014-05-06 ENCOUNTER — Encounter: Payer: Self-pay | Admitting: Family Medicine

## 2014-05-06 ENCOUNTER — Ambulatory Visit (INDEPENDENT_AMBULATORY_CARE_PROVIDER_SITE_OTHER): Payer: BLUE CROSS/BLUE SHIELD | Admitting: Family Medicine

## 2014-05-06 VITALS — BP 112/84 | HR 80 | Temp 98.4°F | Wt 144.0 lb

## 2014-05-06 DIAGNOSIS — G4700 Insomnia, unspecified: Secondary | ICD-10-CM | POA: Diagnosis not present

## 2014-05-06 MED ORDER — TRAZODONE HCL 50 MG PO TABS: 25.0000 mg | ORAL_TABLET | Freq: Every evening | ORAL | Status: DC | PRN

## 2014-05-06 NOTE — Patient Instructions (Signed)
Try the trazodone at night and update me next week if needed.  Start with a half pill.  Take care.  Glad to see you.
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
## 2014-05-11 DIAGNOSIS — G4700 Insomnia, unspecified: Secondary | ICD-10-CM | POA: Insufficient documentation

## 2014-05-11 NOTE — Assessment & Plan Note (Signed)
Exacerbated by recent stressors, dw pt.  Still safe at home, okay for outpatient f/u.   Wouldn't start BZD tx.  D/w pt re: rationale.   Would use trazodone at night and update me next week as needed.  She agrees.

## 2014-05-12 ENCOUNTER — Telehealth: Payer: Self-pay | Admitting: Family Medicine

## 2014-05-12 MED ORDER — CLONAZEPAM 0.5 MG PO TABS: 0.5000 mg | ORAL_TABLET | Freq: Every evening | ORAL | Status: DC | PRN

## 2014-05-12 NOTE — Telephone Encounter (Signed)
Pt called and stated the trazodone you prescribed is causing headaches, dry mouth, and takes over an hour to take effect.  She states a half tablet didn't help at all so she increased it to a whole tablet on Friday night, 05/07/2014. She states took it Monday night and had heart palpitations afterward, but not sure if from medication or not.  She states she took the last dose Monday night, 05/10/2014 and is not resting/sleeping at night.  She would like to see if you could recommend something else.  [facility], [address], [place]. Pt requests c/b. Thank you.

## 2014-05-12 NOTE — Telephone Encounter (Signed)
Thank her for trying it.  Added to intolerance list.   She had used BZD prev.  Okay to try again since she had failed trazodone tx.   Please call in rx.   Note that BZD is for short term use.  This isn't a long term fix.   Thanks.

## 2014-05-12 NOTE — Telephone Encounter (Signed)
Patient notified as instructed by telephone and verbalized understanding. Rx called to pharmacy. 

## 2014-06-11 ENCOUNTER — Ambulatory Visit (INDEPENDENT_AMBULATORY_CARE_PROVIDER_SITE_OTHER)
Admission: RE | Admit: 2014-06-11 | Discharge: 2014-06-11 | Disposition: A | Discharge status: Home / Self Care | Payer: 59 | Source: Ambulatory Visit | Attending: Family Medicine | Admitting: Family Medicine

## 2014-06-11 ENCOUNTER — Encounter: Payer: Self-pay | Admitting: Family Medicine

## 2014-06-11 ENCOUNTER — Ambulatory Visit (INDEPENDENT_AMBULATORY_CARE_PROVIDER_SITE_OTHER): Payer: 59 | Admitting: Family Medicine

## 2014-06-11 VITALS — BP 106/70 | HR 96 | Temp 98.5°F | Wt 136.5 lb

## 2014-06-11 DIAGNOSIS — M25532 Pain in left wrist: Secondary | ICD-10-CM

## 2014-06-11 DIAGNOSIS — M25572 Pain in left ankle and joints of left foot: Secondary | ICD-10-CM | POA: Insufficient documentation

## 2014-06-11 NOTE — Progress Notes (Signed)
Pre visit review using our clinic review tool, if applicable. No additional management support is needed unless otherwise documented below in the visit note.  Missed a step this AM.  L ankle and L wrist pain.  Not injured by another person.   She fell backward going down a set of steps and is unclear about rolling her ankle but likely FOOSH.   No LOC.   Able to bear weight.   Went to work and then L ankle got puffy and sore.   Pain with grip with L hand.  Swelling at distal radial area noted by patient.   Meds, vitals, and allergies reviewed.   ROS: See HPI.  Otherwise, noncontributory.  nad Normal inspection L hand and wrist except for slightly puffy and slightly on flexor side of L wrist, but not ttp/bruised/swollen at the anatomic snuff box.  No dorsal findings on wrist exam.  Normal ROM of the fingers but pain with wrist flexion and touching the 1st and 5th digits together.  No tendon failure in the fingers.   Normal radial pulse, normal cap refill.  Sensation wnl  L ankle with normal inspection except for slightly puffy.  Not bruised.  Med and lat mal not ttp but ttp inferior to lateral mal.  DP pulse wnl, slightly puffy and ttp at the mid 3rd/4th MT Pain with inversion of ankle.  Normal ROM o/w.  nv intact.   xrays reviewed and d/w pt at OV.  No fx seen.

## 2014-06-11 NOTE — Patient Instructions (Signed)
Use the splint while awake and the ankle brace while up.  We'll contact you with your xray reports. Ice as needed.  Try to limit weight bearing as much as possible for now, for comfort.  Gradually wean out of the brace and splint as needed.   Take care.

## 2014-06-11 NOTE — Assessment & Plan Note (Signed)
No fx seen, d/w pt. Likely soft tissue injury.  Can use ice.  Placed in ASO and felt sturdier on standing.  F/u prn.  She agrees.  Gradually can wean out of ASO.  She declined crutches and that is reasonable.   >25 minutes spent in face to face time with patient, >50% spent in counselling or coordination of care.

## 2014-06-11 NOTE — Assessment & Plan Note (Signed)
No fx seen, d/w pt. Likely soft tissue injury.  Can use ice.  Splinted with wrist splint, d/w pt.  F/u prn.  She agrees.

## 2014-06-14 ENCOUNTER — Telehealth: Payer: Self-pay | Admitting: Family Medicine

## 2014-06-14 NOTE — Telephone Encounter (Signed)
Patient advised. Patient asked for note to be faxed to [facility] at [phone]. Faxed note as requested.

## 2014-06-14 NOTE — Telephone Encounter (Signed)
Pt called and says she was able to ice her ankle 4 times a day and kept it elevated all weekend, but today she went to work and it started swelling and hurting on the left side (she cannot elevate it at work).  She says the elevation and ice seems to help.  She wants to know if she could get a note for work to work half days for this week.  Pt has questions about the healing process and would also like a c/b. Thank you.

## 2014-06-14 NOTE — Telephone Encounter (Signed)
Done done, what is the question about healing?  She should gradually improve but if she has sig inc in pain, then we may need to re-image her.  Let me know about her question and I'll work on it.  Thanks.

## 2014-06-18 ENCOUNTER — Other Ambulatory Visit: Payer: Self-pay | Admitting: Family Medicine

## 2014-06-18 NOTE — Telephone Encounter (Signed)
Last office visit 06/11/2014.  Last refill 05/12/2014 for #30 with no refills.  Ok to refill?

## 2014-06-18 NOTE — Telephone Encounter (Signed)
Please call in.  Thanks.   

## 2014-06-18 NOTE — Telephone Encounter (Signed)
Medication phoned to pharmacy.  

## 2014-08-06 ENCOUNTER — Encounter: Payer: Self-pay | Admitting: Family Medicine

## 2014-08-06 ENCOUNTER — Ambulatory Visit (INDEPENDENT_AMBULATORY_CARE_PROVIDER_SITE_OTHER): Payer: 59 | Admitting: Family Medicine

## 2014-08-06 VITALS — BP 104/76 | HR 93 | Temp 97.8°F | Wt 127.0 lb

## 2014-08-06 DIAGNOSIS — R197 Diarrhea, unspecified: Secondary | ICD-10-CM

## 2014-08-06 NOTE — Progress Notes (Signed)
Pre visit review using our clinic review tool, if applicable. No additional management support is needed unless otherwise documented below in the visit note.  GI sx.  Started yesterday.  Had been out to eat the night before.  "I'm still burping it (chili cheese fries) up."  Recently with mult episodes of diarrhea.  Upper abd pain occ, "a knot in the abdomen" followed by diarrhea.  Fatigue.  She isn't passing blood.  Some nausea this AM.  No vomiting.  She is some better today.  H/o cholecystectomy.  No one else has been sick.    She has a similar episode a few weeks ago, possible after greasy food.  No fevers.    Meds, vitals, and allergies reviewed.   ROS: See HPI.  Otherwise, noncontributory.  GEN: nad, alert and oriented HEENT: mucous membranes moist NECK: supple w/o LA CV: rrr.  PULM: ctab, no inc wob ABD: soft, +bs EXT: no edema

## 2014-08-06 NOTE — Patient Instructions (Signed)
Stay off the greasy foods.   You should do fine otherwise.   Pick up the klonopin if needed.  I think it isn't on auto refill, so you'll have to call them.   Take care.  Glad to see you.

## 2014-08-07 DIAGNOSIS — R197 Diarrhea, unspecified: Secondary | ICD-10-CM | POA: Insufficient documentation

## 2014-08-07 NOTE — Assessment & Plan Note (Signed)
Likely related to fat intake with prev cholecystectomy.  Reassured, f/u prn, avoid fatty foods.  She agrees.

## 2014-09-07 ENCOUNTER — Encounter: Payer: Self-pay | Admitting: Family Medicine

## 2014-09-07 ENCOUNTER — Ambulatory Visit (INDEPENDENT_AMBULATORY_CARE_PROVIDER_SITE_OTHER): Payer: 59 | Admitting: Family Medicine

## 2014-09-07 VITALS — BP 104/74 | HR 80 | Temp 98.7°F | Wt 125.5 lb

## 2014-09-07 DIAGNOSIS — R21 Rash and other nonspecific skin eruption: Secondary | ICD-10-CM | POA: Diagnosis not present

## 2014-09-07 MED ORDER — PERMETHRIN 5 % EX CREA: 1.0000 "application " | TOPICAL_CREAM | Freq: Once | CUTANEOUS | Status: DC

## 2014-09-07 NOTE — Progress Notes (Signed)
Pre visit review using our clinic review tool, if applicable. No additional management support is needed unless otherwise documented below in the visit note.  Skin rash noted a few days ago. Spreading in the meantime.  Itches.  On her back B, under the breasts.  On the buttocks and in the groin and waistline.  More itching with scratching.  Others with the similar rash.  She was recently in the woods.  No lower BLE lesions.  No palmar lesions.  Has tried nail polish w/o much relief.  No FCNAVD.  Rash has spread recently.  Worst itching in her life.    Meds, vitals, and allergies reviewed.   ROS: See HPI.  Otherwise, noncontributory.  Chaperoned exam nad Diffuse irregular distribution of excoriated lesions on the trunk and waistline.

## 2014-09-07 NOTE — Patient Instructions (Signed)
Use the cream overnight, wash linens and recently worn clothes, take OTC claritin  a day for itching.

## 2014-09-08 ENCOUNTER — Telehealth: Payer: Self-pay | Admitting: Family Medicine

## 2014-09-08 ENCOUNTER — Encounter (HOSPITAL_COMMUNITY): Payer: Self-pay | Admitting: *Deleted

## 2014-09-08 ENCOUNTER — Emergency Department (HOSPITAL_COMMUNITY)
Admission: EM | Admit: 2014-09-08 | Discharge: 2014-09-08 | Disposition: A | Discharge status: Home / Self Care | Payer: 59 | Source: Home / Self Care | Attending: Family Medicine | Admitting: Family Medicine

## 2014-09-08 DIAGNOSIS — B86 Scabies: Secondary | ICD-10-CM

## 2014-09-08 DIAGNOSIS — R21 Rash and other nonspecific skin eruption: Secondary | ICD-10-CM | POA: Insufficient documentation

## 2014-09-08 NOTE — Telephone Encounter (Signed)
Patient notified as instructed by telephone and verbalized understanding. 

## 2014-09-08 NOTE — Telephone Encounter (Signed)
Please call her back.  I don't think the scraping will change the plan at this point.  I still think that in her case the rash is likely scabies and we should see if the medicine helps.   I can't comment on the other patient's rash if I haven't evaluated them.   If she doesn't improve in a few days, then we'll need to consider other options.

## 2014-09-08 NOTE — Telephone Encounter (Signed)
Pt called and says once she told her friend about the scabies dx he took his 51 yr old daughter to the ER b/c she has the exact same rash as pt and 10 yr old's dad.  She says hospital told Dad that it is definitely not scabies.  Pt wants to know if there's any way you can do a skin scraping of some of the areas she hasn't scratched or touched.  She did use the cream you prescribed to her yesterday.  She's pretty stressed and would like for you to call her back.  Thank you.

## 2014-09-08 NOTE — ED Notes (Signed)
At bedside during dr kindl's exam of patient

## 2014-09-08 NOTE — ED Notes (Signed)
Pt  Reports   A  Rash that  Itches   She  Was   Seen  By  Her  pcp  Yesterday      And  Was  rx   permitin for  Scabies  Pt  Wants a  Second  Opinion    She  Used  meds  As  Directed   And  Says  It is  Somewhat  Better
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
## 2014-09-08 NOTE — Discharge Instructions (Signed)
Retreated rash in 1 week, see your doctor as needed. I do think this is scabies.

## 2014-09-08 NOTE — Assessment & Plan Note (Signed)
Presumed scabies, would treat as such.  See avs.  Use claritin for itching.  This doesn't look like anything other than scabies.

## 2014-10-08 ENCOUNTER — Encounter: Payer: Self-pay | Admitting: Family Medicine

## 2014-10-08 ENCOUNTER — Ambulatory Visit (INDEPENDENT_AMBULATORY_CARE_PROVIDER_SITE_OTHER): Payer: 59 | Admitting: Family Medicine

## 2014-10-08 VITALS — BP 142/74 | HR 95 | Temp 98.4°F | Wt 125.0 lb

## 2014-10-08 DIAGNOSIS — R05 Cough: Secondary | ICD-10-CM

## 2014-10-08 DIAGNOSIS — R059 Cough, unspecified: Secondary | ICD-10-CM

## 2014-10-08 MED ORDER — DOXYCYCLINE HYCLATE 100 MG PO TABS: 100.0000 mg | ORAL_TABLET | Freq: Two times a day (BID) | ORAL | Status: DC

## 2014-10-08 MED ORDER — ALBUTEROL SULFATE HFA 108 (90 BASE) MCG/ACT IN AERS: 2.0000 | INHALATION_SPRAY | Freq: Four times a day (QID) | RESPIRATORY_TRACT | Status: DC | PRN

## 2014-10-08 NOTE — Patient Instructions (Signed)
Star doxy, use the inhaler if needed.  Take care. Update [place] as needed.  Glad to see you.

## 2014-10-08 NOTE — Progress Notes (Signed)
Pre visit review using our clinic review tool, if applicable. No additional management support is needed unless otherwise documented below in the visit note.  Sick contacts noted.  Sx started about 7 days ago.  Initially with a cough.  Sputum is whitish, occ yellowish.  Fatigue, malaise.  Was getting some better then worse in the last few days.   She had some blurry vision in B eyes a few days ago, resolved on its own.  Was brief.  No vision loss.   No vomiting.  No known fevers but has felt cold at work.  No diarrhea.   Smoking 1ppd or less.  She doesn't have a set date to quit.  Encouraged.  Some wheeze noted by patient.  She has occ been SOB, where she has trouble getting a deep breath.   Her prev rash is resolved.    Meds, vitals, and allergies reviewed.   ROS: See HPI.  Otherwise, noncontributory.  GEN: nad, alert and oriented HEENT: mucous membranes moist, tm w/o erythema, nasal exam w/o erythema, clear discharge noted,  OP with cobblestoning NECK: supple w/o LA CV: rrr.   PULM: ctab, no inc wob, no wheeze EXT: no edema SKIN: no acute rash Speaking in complete sentences w/o distress.

## 2014-10-10 DIAGNOSIS — R05 Cough: Secondary | ICD-10-CM | POA: Insufficient documentation

## 2014-10-10 DIAGNOSIS — R059 Cough, unspecified: Secondary | ICD-10-CM | POA: Insufficient documentation

## 2014-10-10 NOTE — Assessment & Plan Note (Signed)
Start doxy, use saba prn.  Given her sx and duration and smoking, would treat.  Nontoxic.  She agrees f/u prn.

## 2014-10-11 ENCOUNTER — Telehealth: Payer: Self-pay | Admitting: Family Medicine

## 2014-10-11 NOTE — Telephone Encounter (Signed)
Patient advised. Letter faxed as requested by patient.

## 2014-10-11 NOTE — Telephone Encounter (Signed)
Pt called and states she isn't feeling any better since she was in last week to see you.  She says it's moved into her head now too. She was not able to go to work today.  She would like a note for work today if at all possible.  Do you need to see her? Please advise, she requests a c/b. Thank you.

## 2014-10-11 NOTE — Telephone Encounter (Signed)
[phone] 

## 2014-10-11 NOTE — Telephone Encounter (Signed)
The doxy should provide good coverage.  Can recheck her if she needs/requests.  I'm okay with continuing as is if she is okay with that.   Letter done.

## 2014-11-08 ENCOUNTER — Encounter: Payer: Self-pay | Admitting: Internal Medicine

## 2014-11-08 ENCOUNTER — Ambulatory Visit (INDEPENDENT_AMBULATORY_CARE_PROVIDER_SITE_OTHER): Payer: 59 | Admitting: Internal Medicine

## 2014-11-08 ENCOUNTER — Encounter: Payer: Self-pay | Admitting: *Deleted

## 2014-11-08 VITALS — BP 126/70 | HR 99 | Temp 98.0°F | Wt 123.1 lb

## 2014-11-08 DIAGNOSIS — J069 Acute upper respiratory infection, unspecified: Secondary | ICD-10-CM

## 2014-11-08 DIAGNOSIS — B9789 Other viral agents as the cause of diseases classified elsewhere: Principal | ICD-10-CM

## 2014-11-08 MED ORDER — HYDROCODONE-HOMATROPINE 5-1.5 MG/5ML PO SYRP: 5.0000 mL | ORAL_SOLUTION | Freq: Three times a day (TID) | ORAL | Status: DC | PRN

## 2014-11-08 NOTE — Progress Notes (Signed)
Pre visit review using our clinic review tool, if applicable. No additional management support is needed unless otherwise documented below in the visit note. 
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
## 2014-11-08 NOTE — Patient Instructions (Signed)
Upper Respiratory Infection, Adult An upper respiratory infection (URI) is also sometimes known as the common cold. The upper respiratory tract includes the nose, sinuses, throat, trachea, and bronchi. Bronchi are the airways leading to the lungs. Most people improve within 1 week, but symptoms can last up to 2 weeks. A residual cough may last even longer.  CAUSES Many different viruses can infect the tissues lining the upper respiratory tract. The tissues become irritated and inflamed and often become very moist. Mucus production is also common. A cold is contagious. You can easily spread the virus to others by oral contact. This includes kissing, sharing a glass, coughing, or sneezing. Touching your mouth or nose and then touching a surface, which is then touched by another person, can also spread the virus. SYMPTOMS  Symptoms typically develop 1 to 3 days after you come in contact with a cold virus. Symptoms vary from person to person. They may include:  Runny nose.  Sneezing.  Nasal congestion.  Sinus irritation.  Sore throat.  Loss of voice (laryngitis).  Cough.  Fatigue.  Muscle aches.  Loss of appetite.  Headache.  Low-grade fever. DIAGNOSIS  You might diagnose your own cold based on familiar symptoms, since most people get a cold 2 to 3 times a year. Your caregiver can confirm this based on your exam. Most importantly, your caregiver can check that your symptoms are not due to another disease such as strep throat, sinusitis, pneumonia, asthma, or epiglottitis. Blood tests, throat tests, and X-rays are not necessary to diagnose a common cold, but they may sometimes be helpful in excluding other more serious diseases. Your caregiver will decide if any further tests are required. RISKS AND COMPLICATIONS  You may be at risk for a more severe case of the common cold if you smoke cigarettes, have chronic heart disease (such as heart failure) or lung disease (such as asthma), or if  you have a weakened immune system. The very young and very old are also at risk for more serious infections. Bacterial sinusitis, middle ear infections, and bacterial pneumonia can complicate the common cold. The common cold can worsen asthma and chronic obstructive pulmonary disease (COPD). Sometimes, these complications can require emergency medical care and may be life-threatening. PREVENTION  The best way to protect against getting a cold is to practice good hygiene. Avoid oral or hand contact with people with cold symptoms. Wash your hands often if contact occurs. There is no clear evidence that vitamin C, vitamin E, echinacea, or exercise reduces the chance of developing a cold. However, it is always recommended to get plenty of rest and practice good nutrition. TREATMENT  Treatment is directed at relieving symptoms. There is no cure. Antibiotics are not effective, because the infection is caused by a virus, not by bacteria. Treatment may include:  Increased fluid intake. Sports drinks offer valuable electrolytes, sugars, and fluids.  Breathing heated mist or steam (vaporizer or shower).  Eating chicken soup or other clear broths, and maintaining good nutrition.  Getting plenty of rest.  Using gargles or lozenges for comfort.  Controlling fevers with ibuprofen or acetaminophen as directed by your caregiver.  Increasing usage of your inhaler if you have asthma. Zinc gel and zinc lozenges, taken in the first 24 hours of the common cold, can shorten the duration and lessen the severity of symptoms. Pain medicines may help with fever, muscle aches, and throat pain. A variety of non-prescription medicines are available to treat congestion and runny nose. Your caregiver   can make recommendations and may suggest nasal or lung inhalers for other symptoms.  HOME CARE INSTRUCTIONS   Only take over-the-counter or prescription medicines for pain, discomfort, or fever as directed by your  caregiver.  Use a warm mist humidifier or inhale steam from a shower to increase air moisture. This may keep secretions moist and make it easier to breathe.  Drink enough water and fluids to keep your urine clear or pale yellow.  Rest as needed.  Return to work when your temperature has returned to normal or as your caregiver advises. You may need to stay home longer to avoid infecting others. You can also use a face mask and careful hand washing to prevent spread of the virus. SEEK MEDICAL CARE IF:   After the first few days, you feel you are getting worse rather than better.  You need your caregiver's advice about medicines to control symptoms.  You develop chills, worsening shortness of breath, or brown or red sputum. These may be signs of pneumonia.  You develop yellow or brown nasal discharge or pain in the face, especially when you bend forward. These may be signs of sinusitis.  You develop a fever, swollen neck glands, pain with swallowing, or white areas in the back of your throat. These may be signs of strep throat. SEEK IMMEDIATE MEDICAL CARE IF:   You have a fever.  You develop severe or persistent headache, ear pain, sinus pain, or chest pain.  You develop wheezing, a prolonged cough, cough up blood, or have a change in your usual mucus (if you have chronic lung disease).  You develop sore muscles or a stiff neck. Document Released: 07/25/2000 Document Revised: 04/23/2011 Document Reviewed: 05/06/2013 Exit[employer] Patient Information 2015 [employer]. This information is not intended to replace advice given to you by your health care provider. Make sure you discuss any questions you have with your health care provider.  

## 2014-11-09 ENCOUNTER — Telehealth: Payer: Self-pay | Admitting: Family Medicine

## 2014-11-09 MED ORDER — ALBUTEROL SULFATE HFA 108 (90 BASE) MCG/ACT IN AERS: 2.0000 | INHALATION_SPRAY | Freq: Four times a day (QID) | RESPIRATORY_TRACT | Status: DC | PRN

## 2014-11-09 NOTE — Telephone Encounter (Signed)
Pt called stating she feels worse today than she did when she came in yesterday.  She states she is coughing and wheezing.  She saw Nicki Reaper yesterday and Rene Kocher wrote a note for her to return work 11/10/2014, however, she states she doesn't think she'll be able to go tomorrow and wants a note for the rest of the week.  She states she is now coughing up white/gray mucous and is more congested overall w/a bad headache.  OTC ibuprofen is not helping and she still has pain around her eyes.  Please advise.  Pt requests c/b. Thank you.

## 2014-11-09 NOTE — Telephone Encounter (Signed)
Please call her.   Note for work done.   Would add on mucinex D with plenty of water.  Update [place] in a few days if not better.   I sent more albuterol to use in the meantime if needed.

## 2014-11-09 NOTE — Telephone Encounter (Signed)
Patient notified and verbalized understanding. She asked that the note be faxed to her job at [phone] attn: [name]. Faxed as requested.

## 2014-12-22 ENCOUNTER — Ambulatory Visit: Payer: 59 | Admitting: Internal Medicine

## 2014-12-22 ENCOUNTER — Encounter: Payer: Self-pay | Admitting: Primary Care

## 2014-12-22 ENCOUNTER — Ambulatory Visit (INDEPENDENT_AMBULATORY_CARE_PROVIDER_SITE_OTHER): Payer: 59 | Admitting: Primary Care

## 2014-12-22 ENCOUNTER — Ambulatory Visit (INDEPENDENT_AMBULATORY_CARE_PROVIDER_SITE_OTHER)
Admission: RE | Admit: 2014-12-22 | Discharge: 2014-12-22 | Disposition: A | Discharge status: Home / Self Care | Payer: 59 | Source: Ambulatory Visit | Attending: Primary Care | Admitting: Primary Care

## 2014-12-22 VITALS — BP 120/70 | HR 89 | Temp 97.6°F | Ht 62.0 in | Wt 124.4 lb

## 2014-12-22 DIAGNOSIS — R109 Unspecified abdominal pain: Secondary | ICD-10-CM

## 2014-12-22 LAB — POCT URINALYSIS DIPSTICK
BILIRUBIN UA: NEGATIVE
Glucose, UA: NEGATIVE
Ketones, UA: NEGATIVE
Leukocytes, UA: NEGATIVE
Nitrite, UA: NEGATIVE
PH UA: 6
Protein, UA: NEGATIVE
Urobilinogen, UA: NEGATIVE

## 2014-12-22 NOTE — Progress Notes (Signed)
Pre visit review using our clinic review tool, if applicable. No additional management support is needed unless otherwise documented below in the visit note. 

## 2014-12-22 NOTE — Patient Instructions (Signed)
Complete xray(s) prior to leaving today. I will contact you regarding your results.  You may take ibuprofen 600 mg three times daily as needed for pain and inflammation.  Increase water consumption.  I will be in touch later today.  It was a pleasure meeting you!
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
## 2014-12-23 ENCOUNTER — Telehealth: Payer: Self-pay | Admitting: Family Medicine

## 2014-12-23 NOTE — Telephone Encounter (Signed)
Pt is calling to find out results of xray.  Please call work number [phone] Thank you

## 2014-12-23 NOTE — Telephone Encounter (Signed)
Called and notified patient of Kate's comments. Patient verbalized understanding.  

## 2014-12-24 ENCOUNTER — Telehealth: Payer: Self-pay

## 2014-12-24 MED ORDER — TRAMADOL HCL 50 MG PO TABS: 50.0000 mg | ORAL_TABLET | Freq: Three times a day (TID) | ORAL | Status: DC | PRN

## 2014-12-24 NOTE — Telephone Encounter (Signed)
Please call in tramadol rx for her to have if needed.  If pain still increases, then needs recheck over the weekend.  Thanks.

## 2014-12-24 NOTE — Telephone Encounter (Signed)
Pt left v/m; seen 12/22/14 and pt is concerned may have kidney stone; pt is not in pain now but wants to know if could have pain med incase has pain over weekend. (see abd xray report 12/22/14).[facility] [address] Pt request cb.

## 2014-12-24 NOTE — Telephone Encounter (Signed)
rx called into pharmacy Spoke with patient and advised results   

## 2015-03-21 ENCOUNTER — Encounter: Payer: Self-pay | Admitting: Primary Care

## 2015-03-21 ENCOUNTER — Ambulatory Visit (INDEPENDENT_AMBULATORY_CARE_PROVIDER_SITE_OTHER): Payer: 59 | Admitting: Primary Care

## 2015-03-21 VITALS — BP 116/72 | HR 105 | Temp 99.3°F | Ht 62.0 in | Wt 127.8 lb

## 2015-03-21 DIAGNOSIS — J029 Acute pharyngitis, unspecified: Secondary | ICD-10-CM

## 2015-03-21 LAB — POCT RAPID STREP A (OFFICE): RAPID STREP A SCREEN: NEGATIVE

## 2015-03-21 MED ORDER — LIDOCAINE VISCOUS 2 % MT SOLN: OROMUCOSAL | Status: DC
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
## 2015-03-21 NOTE — Patient Instructions (Signed)
Your strep test was negative, but i'm sending off for a throat culture.  Sore throat: Ibuprofen 600 mg three times daily, warm salt gargles, chloraseptic spray.  Fevers: Ibuprofen or tylenol.  Nasal Congestion: Try using Flonase (fluticasone) nasal spray. Instill 2 sprays in each nostril once daily.   I will be in touch with you soon.  It was a pleasure meeting you!

## 2015-03-22 ENCOUNTER — Encounter: Payer: Self-pay | Admitting: Family Medicine

## 2015-03-23 ENCOUNTER — Telehealth: Payer: Self-pay | Admitting: Family Medicine

## 2015-03-23 LAB — CULTURE, GROUP A STREP: Organism ID, Bacteria: NORMAL

## 2015-03-23 NOTE — Telephone Encounter (Signed)
Patient returned Chan's call.  Please call patient back at work. °

## 2015-03-23 NOTE — Telephone Encounter (Signed)
Called and notified patient of Kate's comments. Patient verbalized understanding.  

## 2015-05-18 ENCOUNTER — Ambulatory Visit: Payer: 59 | Admitting: Family Medicine

## 2015-05-18 DIAGNOSIS — Z0289 Encounter for other administrative examinations: Secondary | ICD-10-CM

## 2015-08-23 ENCOUNTER — Ambulatory Visit (INDEPENDENT_AMBULATORY_CARE_PROVIDER_SITE_OTHER): Payer: 59 | Admitting: Family Medicine

## 2015-08-23 ENCOUNTER — Encounter: Payer: Self-pay | Admitting: Family Medicine

## 2015-08-23 VITALS — BP 122/70 | HR 90 | Temp 98.4°F | Wt 130.5 lb

## 2015-08-23 DIAGNOSIS — F411 Generalized anxiety disorder: Secondary | ICD-10-CM | POA: Diagnosis not present

## 2015-08-23 DIAGNOSIS — R000 Tachycardia, unspecified: Secondary | ICD-10-CM

## 2015-08-23 DIAGNOSIS — E785 Hyperlipidemia, unspecified: Secondary | ICD-10-CM

## 2015-08-23 DIAGNOSIS — Z119 Encounter for screening for infectious and parasitic diseases, unspecified: Secondary | ICD-10-CM

## 2015-08-23 MED ORDER — METOPROLOL TARTRATE 25 MG PO TABS: 25.0000 mg | ORAL_TABLET | Freq: Two times a day (BID) | ORAL | Status: DC | PRN

## 2015-08-23 MED ORDER — ATORVASTATIN CALCIUM 40 MG PO TABS: 40.0000 mg | ORAL_TABLET | Freq: Every day | ORAL | Status: DC

## 2015-08-23 MED ORDER — CLONAZEPAM 0.5 MG PO TABS: ORAL_TABLET | ORAL | Status: DC

## 2015-08-23 NOTE — Patient Instructions (Addendum)
Fasting labs when possible, before a physical.  Restart your meds.  Update me as needed.  Take care.  Glad to see you.
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
## 2015-08-24 DIAGNOSIS — R000 Tachycardia, unspecified: Secondary | ICD-10-CM | POA: Insufficient documentation

## 2015-08-24 DIAGNOSIS — E785 Hyperlipidemia, unspecified: Secondary | ICD-10-CM | POA: Insufficient documentation

## 2015-08-24 DIAGNOSIS — F411 Generalized anxiety disorder: Secondary | ICD-10-CM | POA: Insufficient documentation

## 2015-08-24 DIAGNOSIS — Z0000 Encounter for general adult medical examination without abnormal findings: Secondary | ICD-10-CM | POA: Insufficient documentation

## 2015-08-24 NOTE — Assessment & Plan Note (Signed)
Can use prn bzd in the meantime.  Okay for outpatient f/u.  No h/o abuse of med. Safe at home.  She is considering options with the relationship, ie the best way to part ways with the man.  D/w pt.  >25 minutes spent in face to face time with patient, >50% spent in counselling or coordination of care.

## 2015-08-24 NOTE — Assessment & Plan Note (Signed)
Can use prn bb, sx sent. She agrees.

## 2015-08-24 NOTE — Assessment & Plan Note (Signed)
Will check hiv and hcv with next set of labs.

## 2015-08-24 NOTE — Assessment & Plan Note (Signed)
Restart statin, recheck labs before next OV.  She agrees.

## 2016-01-09 ENCOUNTER — Encounter: Payer: Self-pay | Admitting: Family Medicine

## 2016-01-09 ENCOUNTER — Ambulatory Visit (INDEPENDENT_AMBULATORY_CARE_PROVIDER_SITE_OTHER): Payer: 59 | Admitting: Family Medicine

## 2016-01-09 VITALS — BP 116/80 | HR 86 | Temp 98.7°F | Ht 62.75 in | Wt 132.8 lb

## 2016-01-09 DIAGNOSIS — Z23 Encounter for immunization: Secondary | ICD-10-CM

## 2016-01-09 DIAGNOSIS — R000 Tachycardia, unspecified: Secondary | ICD-10-CM

## 2016-01-09 DIAGNOSIS — G4700 Insomnia, unspecified: Secondary | ICD-10-CM

## 2016-01-09 DIAGNOSIS — E785 Hyperlipidemia, unspecified: Secondary | ICD-10-CM | POA: Diagnosis not present

## 2016-01-09 DIAGNOSIS — Z1211 Encounter for screening for malignant neoplasm of colon: Secondary | ICD-10-CM | POA: Diagnosis not present

## 2016-01-09 DIAGNOSIS — Z119 Encounter for screening for infectious and parasitic diseases, unspecified: Secondary | ICD-10-CM

## 2016-01-09 DIAGNOSIS — Z0000 Encounter for general adult medical examination without abnormal findings: Secondary | ICD-10-CM | POA: Diagnosis not present

## 2016-01-09 LAB — COMPREHENSIVE METABOLIC PANEL
ALBUMIN: 4.3 g/dL (ref 3.5–5.2)
ALK PHOS: 109 U/L (ref 39–117)
ALT: 11 U/L (ref 0–35)
AST: 18 U/L (ref 0–37)
BUN: 7 mg/dL (ref 6–23)
CHLORIDE: 103 meq/L (ref 96–112)
CO2: 27 mEq/L (ref 19–32)
Calcium: 9.6 mg/dL (ref 8.4–10.5)
Creatinine, Ser: 0.83 mg/dL (ref 0.40–1.20)
GFR: 76.67 mL/min (ref >60.00)
GLUCOSE: 98 mg/dL (ref 70–99)
POTASSIUM: 4.2 meq/L (ref 3.5–5.1)
SODIUM: 138 meq/L (ref 135–145)
TOTAL PROTEIN: 7.7 g/dL (ref 6.0–8.3)
Total Bilirubin: 0.5 mg/dL (ref 0.2–1.2)

## 2016-01-09 LAB — LIPID PANEL
CHOLESTEROL: 233 mg/dL — AB (ref 0–200)
HDL: 62.6 mg/dL (ref >39.00)
LDL CALC: 142 mg/dL — AB (ref 0–99)
NonHDL: 170.12
Total CHOL/HDL Ratio: 4
Triglycerides: 141 mg/dL (ref 0.0–149.0)
VLDL: 28.2 mg/dL (ref 0.0–40.0)

## 2016-01-09 MED ORDER — CLONAZEPAM 0.5 MG PO TABS: ORAL_TABLET | ORAL | 1 refills | Status: DC

## 2016-01-09 MED ORDER — NICOTINE 21 MG/24HR TD PT24: 21.0000 mg | MEDICATED_PATCH | Freq: Every day | TRANSDERMAL | 0 refills | Status: DC

## 2016-01-09 MED ORDER — NICOTINE 7 MG/24HR TD PT24: 7.0000 mg | MEDICATED_PATCH | Freq: Every day | TRANSDERMAL | 0 refills | Status: DC

## 2016-01-09 MED ORDER — NICOTINE 14 MG/24HR TD PT24: 14.0000 mg | MEDICATED_PATCH | Freq: Every day | TRANSDERMAL | 0 refills | Status: DC

## 2016-01-09 NOTE — Assessment & Plan Note (Signed)
Tetanus 2017 Flu done at work.  Done last month.   PNA and shingles not due.  Pap per gynecology.   DXA not due.    Mammogram d/w pt.  See AVS.   D/w patient VH:QIONGEXre:options for colon cancer screening, including IFOB vs. colonoscopy.  Risks and benefits of both were discussed and patient voiced understanding.  Pt elects BMW:UXLKfor:IFOB.   Living will d/w pt.  Would have her mother designated if patient were incapacitated.   Diet and exercise d/w pt. Minimal exercise.   Smoking cessation d/w pt.  She didn't want to take chantix.  D/w pt.  Smoking about 1-1.5 PPD.  She wants to quit for economic reasons.  Wanted to use the patch, discussed options and routine use and quit strategies. HIV and HCV pending, d/w pt.

## 2016-01-09 NOTE — Progress Notes (Signed)
CPE- See plan.  Routine anticipatory guidance given to patient.  See health maintenance. Tetanus 2017 Flu done at work.  Done last month.   PNA and shingles not due.  Pap per gynecology.   DXA not due.    Mammogram d/w pt.  See AVS.   D/w patient ZO:XWRUEAVre:options for colon cancer screening, including IFOB vs. colonoscopy.  Risks and benefits of both were discussed and patient voiced understanding.  Pt elects WUJ:WJXBfor:IFOB.   Living will d/w pt.  Would have her mother designated if patient were incapacitated.   Diet and exercise d/w pt. Minimal exercise.   Smoking cessation d/w pt.  She didn't want to take chantix.  D/w pt.  Smoking about 1-1.5 PPD.  She wants to quit for economic reasons.  Wanted to use the patch, discussed options and routine use and quit strategies. HIV and HCV pending, d/w pt.    H/o tachycardia.  H/o prn BB use. Rare use overall.  No ADE on med prev.  Not used recently.    HLD.  Off statin. Due for labs.  D/w pt.  She didn't start statin prev.    Insomnia.  Prn BZD use at night.  She is working through social stressors.  Safe at home per patient report.  No ADE on med.  Needed refill on med.    PMH and SH reviewed  Meds, vitals, and allergies reviewed.   ROS: Per HPI.  Unless specifically indicated otherwise in HPI, the patient denies:  General: fever. Eyes: acute vision changes ENT: sore throat Cardiovascular: chest pain Respiratory: SOB GI: vomiting GU: dysuria Musculoskeletal: acute back pain Derm: acute rash Neuro: acute motor dysfunction Psych: worsening mood Endocrine: polydipsia Heme: bleeding Allergy: hayfever  GEN: nad, alert and oriented HEENT: mucous membranes moist NECK: supple w/o LA CV: rrr. PULM: ctab, no inc wob ABD: soft, +bs EXT: no edema SKIN: no acute rash

## 2016-01-09 NOTE — Patient Instructions (Addendum)
Price check the patches and see if cheaper with rx. 21mg , then 14mg , then 7mg .   Go to the lab on the way out.  We'll contact you with your lab report.  [facility] [address] [place] [phone]  Take care.  Glad to see you.  Update me as needed.

## 2016-01-09 NOTE — Progress Notes (Signed)
Pre visit review using our clinic review tool, if applicable. No additional management support is needed unless otherwise documented below in the visit note. 

## 2016-01-10 ENCOUNTER — Other Ambulatory Visit: Payer: Self-pay | Admitting: Family Medicine

## 2016-01-10 ENCOUNTER — Telehealth: Payer: Self-pay | Admitting: Family Medicine

## 2016-01-10 DIAGNOSIS — E785 Hyperlipidemia, unspecified: Secondary | ICD-10-CM

## 2016-01-10 LAB — HIV ANTIBODY (ROUTINE TESTING W REFLEX): HIV: NONREACTIVE

## 2016-01-10 LAB — HEPATITIS C ANTIBODY: HCV Ab: NEGATIVE

## 2016-01-10 NOTE — Assessment & Plan Note (Signed)
See notes on labs.  Hasn't been on statin yet.

## 2016-01-10 NOTE — Telephone Encounter (Signed)
Returned call

## 2016-01-10 NOTE — Assessment & Plan Note (Signed)
Continue prn BB.  Smoking cessation will likely help.

## 2016-01-10 NOTE — Assessment & Plan Note (Signed)
Continue prn BZD at night.  No ADE on med.

## 2016-01-10 NOTE — Telephone Encounter (Signed)
Pt returned call - please call back on work number either before 915 am or after 1030 am  [phone] Thanks

## 2016-02-28 ENCOUNTER — Ambulatory Visit (INDEPENDENT_AMBULATORY_CARE_PROVIDER_SITE_OTHER): Payer: 59 | Admitting: Family Medicine

## 2016-02-28 ENCOUNTER — Encounter: Payer: Self-pay | Admitting: Family Medicine

## 2016-02-28 ENCOUNTER — Ambulatory Visit (INDEPENDENT_AMBULATORY_CARE_PROVIDER_SITE_OTHER)
Admission: RE | Admit: 2016-02-28 | Discharge: 2016-02-28 | Disposition: A | Discharge status: Home / Self Care | Payer: 59 | Source: Ambulatory Visit | Attending: Family Medicine | Admitting: Family Medicine

## 2016-02-28 VITALS — BP 122/66 | HR 100 | Temp 98.8°F | Wt 137.5 lb

## 2016-02-28 DIAGNOSIS — R109 Unspecified abdominal pain: Secondary | ICD-10-CM

## 2016-02-28 LAB — POC URINALSYSI DIPSTICK (AUTOMATED)
BILIRUBIN UA: NEGATIVE
GLUCOSE UA: NEGATIVE
KETONES UA: NEGATIVE
Leukocytes, UA: NEGATIVE
Nitrite, UA: NEGATIVE
Protein, UA: NEGATIVE
Urobilinogen, UA: 0.2
pH, UA: 6

## 2016-02-28 MED ORDER — HYDROCODONE-ACETAMINOPHEN 5-325 MG PO TABS: 1.0000 | ORAL_TABLET | Freq: Four times a day (QID) | ORAL | 0 refills | Status: DC | PRN

## 2016-02-28 MED ORDER — TAMSULOSIN HCL 0.4 MG PO CAPS: 0.4000 mg | ORAL_CAPSULE | Freq: Every day | ORAL | 0 refills | Status: DC

## 2016-02-28 NOTE — Progress Notes (Signed)
Pre visit review using our clinic review tool, if applicable. No additional management support is needed unless otherwise documented below in the visit note. 

## 2016-02-28 NOTE — Progress Notes (Signed)
She is going to get the patches for stopping smoking, but not done yet.  D/w pt.    R sided back pain.  Going on for about 1 week, as a mild dull ache initially.  This much worse since 2 am today.  Can't sleep on either side due to pain.  No L sided pain.  No trauma.  No abd pain.  No fevers.  Pain is constant today, worse when she moves.  More pain with coughing.  No fevers.  No vomiting.  No diarrhea.  Urine is normal appearing per patient.  No blood in urine.  No dysuria    PMH. H/o renal stones noted.  SH reviewed.   Meds, vitals, and allergies reviewed.   ROS: Per HPI unless specifically indicated in ROS section   GEN: nad, alert and oriented HEENT: mucous membranes moist NECK: supple w/o LA CV: rrr PULM: ctab, no inc wob ABD: soft, +bs, not ttp, RUQ not ttp but R flank slightly ttp w/o CVA pain EXT: no edema SKIN: no acute rash

## 2016-02-28 NOTE — Patient Instructions (Signed)
Presumed stone.  Xray on the way out.  We'll update you with the results.  Plenty of fluids in the meantime.  Take vicodin for pain.  Start flomax- take it daily.  Take care.  Glad to see you.  Update us as needed.

## 2016-02-29 ENCOUNTER — Encounter: Payer: Self-pay | Admitting: Family Medicine

## 2016-02-29 DIAGNOSIS — R109 Unspecified abdominal pain: Secondary | ICD-10-CM | POA: Insufficient documentation

## 2016-02-29 NOTE — Assessment & Plan Note (Signed)
New issue.  Presumed stone. Nontoxic.   Xray on the way out.  Imaging reviewed, see notes on report.   Plenty of fluids in the meantime.  Take vicodin for pain.  Start flomax- take it daily.  U/a d/w pt at OV.   Update us as needed.  Okay for outpatient f/u.

## 2016-03-04 LAB — URINE CULTURE

## 2016-03-26 ENCOUNTER — Other Ambulatory Visit: Payer: 59

## 2017-03-19 ENCOUNTER — Ambulatory Visit: Payer: Self-pay | Admitting: *Deleted

## 2017-03-19 NOTE — Telephone Encounter (Signed)
Pt reports  Pain  Under r  Ribcage    Area  r  Upper  Quadrant  .  Denies  Any  resp  Distress  Speaking in  Complete  sentances .   Denies  Any  Nausea  Vomiting   . Has  Had  GB  Removed . States  Does  Not  Feel like a  Kidney  Stone . Appt  Made  tommorow  With  Dr  Sharen HonesGutierrez . ADVISED IF  WORSE  CALL BACK  OR  GO TO  ER      Reason for Disposition . [1] MODERATE pain (e.g., interferes with normal activities) AND [2] comes and goes (cramps) AND [3] present > 24 hours  (Exception: pain with Vomiting or Diarrhea - see that Guideline)  Answer Assessment - Initial Assessment Questions 1. LOCATION: "Where does it hurt?"     r upper  Quadrant  Underneath  Rib  Cage     2. RADIATION: "Does the pain shoot anywhere else?" (e.g., chest, back)       Staying  In  One    Spot   3. ONSET: "When did the pain begin?" (e.g., minutes, hours or days ago)         Yest    4. SUDDEN: "Gradual or sudden onset?"           GRADUALLY   GETTING  WORSE   SINCE  YESTERDAY  5. PATTERN "Does the pain come and go, or is it constant?"    - If constant: "Is it getting better, staying the same, or worsening?"      (Note: Constant means the pain never goes away completely; most serious pain is constant and it progresses)     - If intermittent: "How long does it last?" "Do you have pain now?"     (Note: Intermittent means the pain goes away completely between bouts)       CONSTANT     6. SEVERITY: "How bad is the pain?"  (e.g., Scale 1-10; mild, moderate, or severe)    - MILD (1-3): doesn't interfere with normal activities, abdomen soft and not tender to touch     - MODERATE (4-7): interferes with normal activities or awakens from sleep, tender to touch     - SEVERE (8-10): excruciating pain, doubled over, unable to do any normal activities           5 7. RECURRENT SYMPTOM: "Have you ever had this type of abdominal pain before?" If so, ask: "When was the last time?" and "What happened that time?"         No  8. AGGRAVATING  FACTORS: "Does anything seem to cause this pain?" (e.g., foods, stress, alcohol)      Worse   When   Takes  A  Breath     9. CARDIAC SYMPTOMS: "Do you have any of the following symptoms: chest pain, difficulty breathing, sweating, nausea?"        no 10. OTHER SYMPTOMS: "Do you have any other symptoms?" (e.g., fever, vomiting, diarrhea)       no 11. PREGNANCY: "Is there any chance you are pregnant?" "When was your last menstrual period?"         No  Protocols used: ABDOMINAL PAIN - UPPER-A-AH

## 2017-03-20 ENCOUNTER — Ambulatory Visit (INDEPENDENT_AMBULATORY_CARE_PROVIDER_SITE_OTHER): Payer: 59 | Admitting: Family Medicine

## 2017-03-20 ENCOUNTER — Encounter: Payer: Self-pay | Admitting: Family Medicine

## 2017-03-20 VITALS — BP 118/80 | HR 94 | Temp 98.3°F | Wt 145.5 lb

## 2017-03-20 DIAGNOSIS — R319 Hematuria, unspecified: Secondary | ICD-10-CM

## 2017-03-20 DIAGNOSIS — J069 Acute upper respiratory infection, unspecified: Secondary | ICD-10-CM | POA: Insufficient documentation

## 2017-03-20 DIAGNOSIS — R0789 Other chest pain: Secondary | ICD-10-CM | POA: Diagnosis not present

## 2017-03-20 LAB — URINALYSIS, ROUTINE W REFLEX MICROSCOPIC
BILIRUBIN URINE: NEGATIVE
Ketones, ur: NEGATIVE
LEUKOCYTES UA: NEGATIVE
Nitrite: NEGATIVE
PH: 6 (ref 5.0–8.0)
TOTAL PROTEIN, URINE-UPE24: NEGATIVE
URINE GLUCOSE: NEGATIVE
UROBILINOGEN UA: 0.2 (ref 0.0–1.0)

## 2017-03-20 LAB — POC URINALSYSI DIPSTICK (AUTOMATED)
BILIRUBIN UA: NEGATIVE
Glucose, UA: NEGATIVE
Ketones, UA: NEGATIVE
LEUKOCYTES UA: NEGATIVE
NITRITE UA: NEGATIVE
PH UA: 6 (ref 5.0–8.0)
PROTEIN UA: NEGATIVE
Spec Grav, UA: 1.02 (ref 1.010–1.025)
Urobilinogen, UA: 0.2 E.U./dL

## 2017-03-20 MED ORDER — CYCLOBENZAPRINE HCL 10 MG PO TABS
5.0000 mg | ORAL_TABLET | Freq: Two times a day (BID) | ORAL | 0 refills | Status: DC | PRN
Start: 2017-03-20 — End: 2019-12-21

## 2017-03-20 NOTE — Patient Instructions (Addendum)
I think you have R ribcage strain from coughing from last week. Treat with heating pad to area, flexeril muscle relaxant (sedation precautions), and resting side.  Let us know if head cold (cough/congestion) not improving over the next few days.  For blood in urine - unclear if truly blood or just poor sample.  Repeat urine sample today - if unable then return in next 1-2 weeks for repeat urinalysis with microscopy.

## 2017-03-20 NOTE — Progress Notes (Signed)
BP 118/80 (BP Location: Left Arm, Patient Position: Sitting, Cuff Size: Normal)   Pulse 94   Temp 98.3 F (36.8 C) (Oral)   Wt 145 lb 8 oz (66 kg)   LMP 12/14/2010   SpO2 96%   BMI 25.98 kg/m    CC: R flank pain Subjective:    Patient ID: Stacey CalixAngela K Moller, female    DOB: October 12, 1965, 52 y.o.   MRN: [id number]  HPI: Stacey Martin is a 52 y.o. female presenting on 03/20/2017 for Flank Pain (Right flank dull pain. Took a natural laxative that night. Denies N/V/D or fever. Started 03/18/17. Has similar pain in 02/2016, was given Flomax. H/o kidney stones.)   2d h/o R flank pain worse with cough or deep breath. Catching pain. This doesn't feel as painful as kidney stones. Denies inciting trauma/injury or new new exercise routine. Ongoing head congestion rhinorrhea and cough for several weeks, worsened at the beginning of this week. Took natural laxative 2 nights ago with some improvement.   No fevers/chills, abd pain, nausea/vomiting, ST, ear or tooth pain, diarrhea. No urinary changes (dysuria, urgency).   Current every day smoker.  H/o kidney stones previously treated with flomax - she never passed kidney stone. She has had KUBs negative for stone in the past. They have shown constipation.  Relevant past medical, surgical, family and social history reviewed and updated as indicated. Interim medical history since our last visit reviewed. Allergies and medications reviewed and updated. Outpatient Medications Prior to Visit  Medication Sig Dispense Refill  . metoprolol tartrate (LOPRESSOR) 25 MG tablet Take 1 tablet (25 mg total) by mouth 2 (two) times daily as needed. 60 tablet 3  . atorvastatin (LIPITOR) 40 MG tablet Take 1 tablet (40 mg total) by mouth daily. (Patient not taking: Reported on 03/20/2017) 30 tablet 3  . clonazePAM (KLONOPIN) 0.5 MG tablet TAKE 1 TABLET BY MOUTH AT BEDTIME AS NEEDED FOR INSOMNIA (Patient not taking: Reported on 03/20/2017) 30 tablet 1  . nicotine (NICODERM CQ) 14  mg/24hr patch Place 1 patch (14 mg total) onto the skin daily. (Patient not taking: Reported on 03/20/2017) 28 patch 0  . nicotine (NICODERM CQ) 21 mg/24hr patch Place 1 patch (21 mg total) onto the skin daily. (Patient not taking: Reported on 03/20/2017) 28 patch 0  . nicotine (NICODERM CQ) 7 mg/24hr patch Place 1 patch (7 mg total) onto the skin daily. (Patient not taking: Reported on 03/20/2017) 28 patch 0  . HYDROcodone-acetaminophen (NORCO/VICODIN) 5-325 MG tablet Take 1 tablet by mouth every 6 (six) hours as needed for moderate pain. 20 tablet 0  . tamsulosin (FLOMAX) 0.4 MG CAPS capsule Take 1 capsule (0.4 mg total) by mouth daily. 30 capsule 0   No facility-administered medications prior to visit.      Per HPI unless specifically indicated in ROS section below Review of Systems     Objective:    BP 118/80 (BP Location: Left Arm, Patient Position: Sitting, Cuff Size: Normal)   Pulse 94   Temp 98.3 F (36.8 C) (Oral)   Wt 145 lb 8 oz (66 kg)   LMP 12/14/2010   SpO2 96%   BMI 25.98 kg/m   Wt Readings from Last 3 Encounters:  03/20/17 145 lb 8 oz (66 kg)  02/28/16 137 lb 8 oz (62.4 kg)  01/09/16 132 lb 12 oz (60.2 kg)    Physical Exam  Constitutional: She appears well-developed and well-nourished. No distress.  HENT:  Head: Normocephalic and atraumatic.  Right Ear: Hearing, tympanic membrane, external ear and ear canal normal.  Left Ear: Hearing, tympanic membrane, external ear and ear canal normal.  Nose: Mucosal edema (nasal mucosal congestion) present. No rhinorrhea. Right sinus exhibits no maxillary sinus tenderness and no frontal sinus tenderness. Left sinus exhibits no maxillary sinus tenderness and no frontal sinus tenderness.  Mouth/Throat: Uvula is midline, oropharynx is clear and moist and mucous membranes are normal. No oropharyngeal exudate, posterior oropharyngeal edema, posterior oropharyngeal erythema or tonsillar abscesses.  Eyes: Conjunctivae and EOM are normal.  Pupils are equal, round, and reactive to light. No scleral icterus.  Neck: Normal range of motion. Neck supple.  Cardiovascular: Normal rate, regular rhythm, normal heart sounds and intact distal pulses.  No murmur heard. Pulmonary/Chest: Effort normal and breath sounds normal. No respiratory distress. She has no wheezes. She has no rales. She exhibits tenderness.    Chest wall tenderness to palpation R lateral and anterior lower ribcage  Abdominal: Soft. Bowel sounds are normal. She exhibits no distension and no mass. There is no hepatosplenomegaly. There is no tenderness. There is no rebound, no guarding and no CVA tenderness.  Lymphadenopathy:    She has no cervical adenopathy.  Skin: Skin is warm and dry. No rash noted.  Nursing note and vitals reviewed.  Results for orders placed or performed in visit on 03/20/17  POCT Urinalysis Dipstick (Automated)  Result Value Ref Range   Color, UA yellow    Clarity, UA clear    Glucose, UA negative    Bilirubin, UA negative    Ketones, UA negative    Spec Grav, UA 1.020 1.010 - 1.025   Blood, UA 2+    pH, UA 6.0 5.0 - 8.0   Protein, UA negative    Urobilinogen, UA 0.2 0.2 or 1.0 E.U./dL   Nitrite, UA negative    Leukocytes, UA Negative Negative  POCT Urinalysis Dipstick (Automated)  Result Value Ref Range   Color, UA straw    Clarity, UA clear    Glucose, UA negative    Bilirubin, UA negative    Ketones, UA negative    Spec Grav, UA 1.015 1.010 - 1.025   Blood, UA 2+    pH, UA 6.0 5.0 - 8.0   Protein, UA negative    Urobilinogen, UA 0.2 0.2 or 1.0 E.U./dL   Nitrite, UA negative    Leukocytes, UA Negative Negative      Assessment & Plan:   Problem List Items Addressed This Visit    Hematuria    Unclear cause - discomfort today not consistent with kidney stone. Given ongoing hematuria (present 02/2016) and smoker will send UA/micro to lab to confirm and if persistent will refer to urology. Pt agrees with plan.       Relevant  Orders   Urinalysis, Routine w reflex microscopic   Right-sided chest wall pain - Primary    Story/exam today consistent with chest wall strain, anticipate after recent URI with cough. Rx flexeril, tylenol, and heating pad to area. Update if not improving with treatment. Pt agrees with plan.       Relevant Orders   POCT Urinalysis Dipstick (Automated) (Completed)   POCT Urinalysis Dipstick (Automated) (Completed)   URI with cough and congestion    Ongoing for last 2 weeks. Advised continue to monitor, if persistent symptoms or worsening or fever >101, to update me - low threshold to treat possible bacterial sinusitis with abx course. Pt feels it may be improving.  Follow up plan: Return if symptoms worsen or fail to improve.  Ria Bush, MD

## 2017-03-20 NOTE — Assessment & Plan Note (Signed)
Story/exam today consistent with chest wall strain, anticipate after recent URI with cough. Rx flexeril, tylenol, and heating pad to area. Update if not improving with treatment. Pt agrees with plan.

## 2017-03-20 NOTE — Assessment & Plan Note (Signed)
Unclear cause - discomfort today not consistent with kidney stone. Given ongoing hematuria (present 02/2016) and smoker will send UA/micro to lab to confirm and if persistent will refer to urology. Pt agrees with plan.

## 2017-03-20 NOTE — Assessment & Plan Note (Signed)
Ongoing for last 2 weeks. Advised continue to monitor, if persistent symptoms or worsening or fever >101, to update me - low threshold to treat possible bacterial sinusitis with abx course. Pt feels it may be improving.

## 2017-03-21 NOTE — Progress Notes (Signed)
Order(s) created erroneously. Erroneous order ID: [id number]  Order moved by: Margarette CanadaSHEALY, DEBRA D  Order move date/time: 03/21/2017 12:31 PM  Source Patient: [id number]  Source Contact: 03/20/2017  Destination Patient: [id number]  Destination Contact: 07/12/2016

## 2017-03-22 ENCOUNTER — Telehealth: Payer: Self-pay | Admitting: Family Medicine

## 2017-03-22 DIAGNOSIS — R319 Hematuria, unspecified: Secondary | ICD-10-CM

## 2017-03-22 DIAGNOSIS — F17200 Nicotine dependence, unspecified, uncomplicated: Secondary | ICD-10-CM

## 2017-03-22 NOTE — Telephone Encounter (Signed)
plz notify rpt urine test returned positive for blood as well. For this reason I'd like to refer to urology for further evaluation as discussed at office visit.  Referral placed.

## 2017-03-22 NOTE — Telephone Encounter (Signed)
Noted. Thanks.  Will await uro input.

## 2017-03-22 NOTE — Telephone Encounter (Signed)
Spoke with pt relaying message per Dr. G. Pt verbalizes understanding. 

## 2017-03-26 ENCOUNTER — Ambulatory Visit (INDEPENDENT_AMBULATORY_CARE_PROVIDER_SITE_OTHER): Payer: 59 | Admitting: Family Medicine

## 2017-03-26 ENCOUNTER — Encounter: Payer: Self-pay | Admitting: Family Medicine

## 2017-03-26 ENCOUNTER — Ambulatory Visit (INDEPENDENT_AMBULATORY_CARE_PROVIDER_SITE_OTHER)
Admission: RE | Admit: 2017-03-26 | Discharge: 2017-03-26 | Disposition: A | Discharge status: Home / Self Care | Payer: 59 | Source: Ambulatory Visit | Attending: Family Medicine | Admitting: Family Medicine

## 2017-03-26 VITALS — BP 122/84 | HR 91 | Temp 97.9°F | Wt 147.2 lb

## 2017-03-26 DIAGNOSIS — R109 Unspecified abdominal pain: Secondary | ICD-10-CM | POA: Diagnosis not present

## 2017-03-26 DIAGNOSIS — R319 Hematuria, unspecified: Secondary | ICD-10-CM | POA: Diagnosis not present

## 2017-03-26 LAB — POC URINALSYSI DIPSTICK (AUTOMATED)
BILIRUBIN UA: NEGATIVE
Glucose, UA: NEGATIVE
Ketones, UA: NEGATIVE
LEUKOCYTES UA: NEGATIVE
NITRITE UA: NEGATIVE
PH UA: 6.5 (ref 5.0–8.0)
Protein, UA: NEGATIVE
RBC UA: NEGATIVE
Spec Grav, UA: <=1.005 — AB (ref 1.010–1.025)
UROBILINOGEN UA: 0.2 U/dL

## 2017-03-26 NOTE — Progress Notes (Signed)
F/u for hematuria and flank pain.  She has referral to urology pending.  She has some R flank pain prev.  She was sen last week.  Then last week she had some sneezing fits and also leaned over to try to take off her R boot.  All of that make the pain worse.  No fevers.  She took flexeril recently, with some relief, she slept better after taking the med.  Her pain is better not not totally resolved; clearly better.  Sometimes of the day the pain is better than others.    She does have h/o stones in the past.    She doesn't have dysuria.  BM this AM, no known blood in stool.    She hasn't tried nicotine patches yet.  She hasn't set a quit date yet.    She never took statin.  She hasn't been on BZD in the last year.  Med list updated.    Meds, vitals, and allergies reviewed.   ROS: Per HPI unless specifically indicated in ROS section   GEN: nad, alert and oriented HEENT: mucous membranes moist NECK: supple w/o LA CV: rrr PULM: ctab, no inc wob ABD: soft, +bs, not ttp EXT: no edema SKIN: no acute rash R flank not ttp

## 2017-03-26 NOTE — Patient Instructions (Signed)
Go to the lab on the way out.  We'll contact you with your lab and xray report. Take care.  Glad to see you.  I'll check on your referral.  Update me if the pain is worse.

## 2017-03-26 NOTE — Telephone Encounter (Signed)
[facility]

## 2017-03-28 ENCOUNTER — Telehealth: Payer: Self-pay

## 2017-03-28 NOTE — Assessment & Plan Note (Signed)
Referral to urology is pending.  Her flank pain improved after taking Flexeril.  She is clearly better from that standpoint.  We talked about hematuria evaluation.  It makes sense for her to see urology since she is a smoker.  Encourage smoking cessation.  She will consider.  At this point check KUB.  See notes on imaging.  She will update me if she has a change in symptoms.

## 2017-03-28 NOTE — Telephone Encounter (Signed)
I do not see any messages, labs or imaging results that anyone tried to relay to pt.

## 2017-03-28 NOTE — Telephone Encounter (Signed)
Copied from CRM [id number]. Topic: General - Call Back - No Documentation >> Mar 28, 2017  1:54 PM Windy KalataMichael, Taylor L, NT wrote: Patient states she missed a call from the office. I do not see any anything documented. Please advise.

## 2017-04-12 ENCOUNTER — Encounter: Payer: Self-pay | Admitting: Family Medicine

## 2017-04-12 ENCOUNTER — Ambulatory Visit (INDEPENDENT_AMBULATORY_CARE_PROVIDER_SITE_OTHER): Payer: 59 | Admitting: Family Medicine

## 2017-04-12 VITALS — BP 124/72 | HR 106 | Temp 98.8°F | Wt 148.0 lb

## 2017-04-12 DIAGNOSIS — R05 Cough: Secondary | ICD-10-CM

## 2017-04-12 DIAGNOSIS — B9789 Other viral agents as the cause of diseases classified elsewhere: Secondary | ICD-10-CM | POA: Diagnosis not present

## 2017-04-12 DIAGNOSIS — R059 Cough, unspecified: Secondary | ICD-10-CM

## 2017-04-12 DIAGNOSIS — M791 Myalgia, unspecified site: Secondary | ICD-10-CM

## 2017-04-12 DIAGNOSIS — J069 Acute upper respiratory infection, unspecified: Secondary | ICD-10-CM | POA: Diagnosis not present
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
## 2017-04-12 NOTE — Patient Instructions (Signed)
Your flu test is negative, I think you have a viral upper respiratory infection  For nasal congestion you can use Afrin nasal spray for 3 days max, Sudafed, saline nasal spray (generic is fine for all). For cough you can try Delsym. Drink enough fluids to make your urine light yellow. For fever/chill/muscle aches you can take over the counter acetaminophen or ibuprofen.  Please come back in if you are not better in 5-7 days or if you develop wheezing, shortness of breath or persistent vomiting.    Upper Respiratory Infection, Adult Most upper respiratory infections (URIs) are caused by a virus. A URI affects the nose, throat, and upper air passages. The most common type of URI is often called "the common cold." Follow these instructions at home:  Take medicines only as told by your doctor.  Gargle warm saltwater or take cough drops to comfort your throat as told by your doctor.  Use a warm mist humidifier or inhale steam from a shower to increase air moisture. This may make it easier to breathe.  Drink enough fluid to keep your pee (urine) clear or pale yellow.  Eat soups and other clear broths.  Have a healthy diet.  Rest as needed.  Go back to work when your fever is gone or your doctor says it is okay. ? You may need to stay home longer to avoid giving your URI to others. ? You can also wear a face mask and wash your hands often to prevent spread of the virus.  Use your inhaler more if you have asthma.  Do not use any tobacco products, including cigarettes, chewing tobacco, or electronic cigarettes. If you need help quitting, ask your doctor. Contact a doctor if:  You are getting worse, not better.  Your symptoms are not helped by medicine.  You have chills.  You are getting more short of breath.  You have brown or red mucus.  You have yellow or brown discharge from your nose.  You have pain in your face, especially when you bend forward.  You have a  fever.  You have puffy (swollen) neck glands.  You have pain while swallowing.  You have white areas in the back of your throat. Get help right away if:  You have very bad or constant: ? Headache. ? Ear pain. ? Pain in your forehead, behind your eyes, and over your cheekbones (sinus pain). ? Chest pain.  You have long-lasting (chronic) lung disease and any of the following: ? Wheezing. ? Long-lasting cough. ? Coughing up blood. ? A change in your usual mucus.  You have a stiff neck.  You have changes in your: ? Vision. ? Hearing. ? Thinking. ? Mood. This information is not intended to replace advice given to you by your health care provider. Make sure you discuss any questions you have with your health care provider. Document Released: 07/18/2007 Document Revised: 10/02/2015 Document Reviewed: 05/06/2013 Elsevier Interactive Patient Education  2018 [employer]

## 2017-04-15 ENCOUNTER — Encounter: Payer: Self-pay | Admitting: Family Medicine

## 2017-04-15 LAB — POCT INFLUENZA A/B
INFLUENZA A, POC: NEGATIVE
INFLUENZA B, POC: NEGATIVE

## 2017-04-18 DIAGNOSIS — R311 Benign essential microscopic hematuria: Secondary | ICD-10-CM | POA: Diagnosis not present

## 2017-05-02 DIAGNOSIS — R311 Benign essential microscopic hematuria: Secondary | ICD-10-CM | POA: Diagnosis not present

## 2017-05-02 DIAGNOSIS — R3129 Other microscopic hematuria: Secondary | ICD-10-CM | POA: Diagnosis not present

## 2017-05-14 DIAGNOSIS — R311 Benign essential microscopic hematuria: Secondary | ICD-10-CM | POA: Diagnosis not present

## 2017-05-15 ENCOUNTER — Other Ambulatory Visit: Payer: Self-pay | Admitting: Family Medicine

## 2017-05-15 NOTE — Telephone Encounter (Signed)
Electronic refill request Last office visit 04/12/17/acute Last refill 08/23/15 #60/3 Last CPE 01/09/16

## 2017-05-15 NOTE — Telephone Encounter (Signed)
Sent.  Schedule CPE when possible.  Thanks.  

## 2017-05-15 NOTE — Telephone Encounter (Signed)
appt scheduled-Stacey Martin, RMA

## 2017-05-15 NOTE — Telephone Encounter (Signed)
Please schedule CPE as instructed. 

## 2017-06-11 ENCOUNTER — Telehealth: Payer: Self-pay

## 2017-06-11 ENCOUNTER — Encounter: Payer: 59 | Admitting: Family Medicine

## 2017-06-11 NOTE — Telephone Encounter (Signed)
PLEASE NOTE: All timestamps contained within this report are represented as [place] Standard Time. CONFIDENTIALTY NOTICE: This fax transmission is intended only for the addressee. It contains information that is legally privileged, confidential or otherwise protected from use or disclosure. If you are not the intended recipient, you are strictly prohibited from reviewing, disclosing, copying using or disseminating any of this information or taking any action in reliance on or regarding this information. If you have received this fax in error, please notify us immediately by telephone so that we can arrange for its return to [place]. Phone: [phone], Toll-Free: [phone], Fax: [phone] Page: 1 of 1 Call Id: [id number] [facility] Night - Client Nonclinical Telephone Record [facility] Client [facility] Night - Client Client Site [facility] - Night Contact Type Call Who Is Calling Patient / Member / Family / Caregiver Caller Name Kamia Insalaco Caller Phone Number [phone] Patient Name Maliaka Brasington Patient DOB [birth year] Call Type Message Only Information Provided Reason for Call Request to Cancel Office Appointment Initial Comment Caller States she needs to cancel her appointment. Additional Comment Call Closed By: Bradley Ferris Transaction Date/Time: 06/11/2017 6:06:47 AM (ET)

## 2017-09-06 ENCOUNTER — Telehealth: Payer: Self-pay | Admitting: Family Medicine

## 2017-09-06 ENCOUNTER — Other Ambulatory Visit: Payer: Self-pay | Admitting: Family Medicine

## 2017-09-06 DIAGNOSIS — Z1231 Encounter for screening mammogram for malignant neoplasm of breast: Secondary | ICD-10-CM

## 2017-09-06 NOTE — Telephone Encounter (Signed)
Spoke to pt and scheduled for 7/29

## 2017-09-06 NOTE — Telephone Encounter (Signed)
Copied from CRM [id number]. Topic: Quick Communication - See Telephone Encounter >> Sep 06, 2017 10:18 AM Luanna Coleawoud, Jessica L wrote: CRM for notification. See Telephone encounter for: 09/06/17. Pt called and stated that she is having some tenderness in left breast. Pt would like a referral for a diagnostic test. Please advise. Breast center and imaging on [address] [place]. Please advise. (Work)CB#[phone] Cell#[phone]

## 2017-09-09 ENCOUNTER — Encounter: Payer: Self-pay | Admitting: Family Medicine

## 2017-09-09 ENCOUNTER — Ambulatory Visit (INDEPENDENT_AMBULATORY_CARE_PROVIDER_SITE_OTHER): Payer: 59 | Admitting: Family Medicine

## 2017-09-09 VITALS — BP 132/78 | HR 76 | Temp 98.5°F | Ht 62.75 in | Wt 150.5 lb

## 2017-09-09 DIAGNOSIS — N644 Mastodynia: Secondary | ICD-10-CM

## 2017-09-09 NOTE — Progress Notes (Signed)
Her prev flank pain resolved.  She had neg eval with urology per patient report with plan for only prn uro f/u.   She is considering options re: smoking.  She has the patch and is going to start that at some point.    She has some L sided breast pain. No R sided sx.  L breast, intermittently.  May not have sx for weeks.  L breast at about 3 o'clock.  No trauma.  No bruising.  No nipple discharge.  No mass or lump noted by patient. Not ttp now.  No LA in the axilla noted by patient.    [facility] [place].    Meds, vitals, and allergies reviewed.   ROS: Per HPI unless specifically indicated in ROS section   nad ncat rrr ctab Breast exam: No mass, nodules, thickening, tenderness, bulging, retraction, inflamation, nipple discharge or skin changes noted on either breast except minimally ttp ~2cm inferior to the L nipple w/o mass noted.  No axillary or clavicular LA.  Chaperoned exam.

## 2017-09-09 NOTE — Patient Instructions (Signed)
Go see Marion on the way out.  Take care.  Glad to see you.   

## 2017-09-10 DIAGNOSIS — N644 Mastodynia: Secondary | ICD-10-CM | POA: Insufficient documentation

## 2017-09-10 NOTE — Assessment & Plan Note (Signed)
No focal mass noted.  Refer for diagnostic mammogram and ultrasound.  Rationale discussed with patient.  Update me as needed.  She agrees.

## 2017-09-13 ENCOUNTER — Ambulatory Visit
Admission: RE | Admit: 2017-09-13 | Discharge: 2017-09-13 | Disposition: A | Discharge status: Home / Self Care | Payer: 59 | Source: Ambulatory Visit | Attending: Family Medicine | Admitting: Family Medicine

## 2017-09-13 DIAGNOSIS — N644 Mastodynia: Secondary | ICD-10-CM

## 2017-09-13 DIAGNOSIS — R922 Inconclusive mammogram: Secondary | ICD-10-CM | POA: Diagnosis not present

## 2018-09-18 ENCOUNTER — Other Ambulatory Visit: Payer: Self-pay

## 2018-09-18 ENCOUNTER — Other Ambulatory Visit: Payer: Self-pay | Admitting: Family Medicine

## 2018-09-18 DIAGNOSIS — Z20822 Contact with and (suspected) exposure to covid-19: Secondary | ICD-10-CM

## 2018-09-20 LAB — NOVEL CORONAVIRUS, NAA: SARS-CoV-2, NAA: NOT DETECTED

## 2018-09-20 LAB — SPECIMEN STATUS REPORT

## 2018-11-29 ENCOUNTER — Encounter (HOSPITAL_COMMUNITY): Payer: Self-pay | Admitting: Emergency Medicine

## 2018-11-29 ENCOUNTER — Emergency Department (HOSPITAL_COMMUNITY)
Admission: EM | Admit: 2018-11-29 | Discharge: 2018-11-29 | Disposition: A | Discharge status: Home / Self Care | Payer: 59 | Attending: Emergency Medicine | Admitting: Emergency Medicine

## 2018-11-29 DIAGNOSIS — I471 Supraventricular tachycardia: Secondary | ICD-10-CM | POA: Diagnosis not present

## 2018-11-29 DIAGNOSIS — F17210 Nicotine dependence, cigarettes, uncomplicated: Secondary | ICD-10-CM | POA: Diagnosis not present

## 2018-11-29 DIAGNOSIS — N189 Chronic kidney disease, unspecified: Secondary | ICD-10-CM | POA: Diagnosis not present

## 2018-11-29 DIAGNOSIS — R000 Tachycardia, unspecified: Secondary | ICD-10-CM | POA: Diagnosis present

## 2018-11-29 LAB — I-STAT CHEM 8, ED
BUN: 8 mg/dL (ref 6–20)
Calcium, Ion: 1.05 mmol/L — ABNORMAL LOW (ref 1.15–1.40)
Chloride: 106 mmol/L (ref 98–111)
Creatinine, Ser: 0.8 mg/dL (ref 0.44–1.00)
Glucose, Bld: 177 mg/dL — ABNORMAL HIGH (ref 70–99)
HCT: 48 % — ABNORMAL HIGH (ref 36.0–46.0)
Hemoglobin: 16.3 g/dL — ABNORMAL HIGH (ref 12.0–15.0)
Potassium: 3.5 mmol/L (ref 3.5–5.1)
Sodium: 137 mmol/L (ref 135–145)
TCO2: 17 mmol/L — ABNORMAL LOW (ref 22–32)

## 2018-11-29 MED ORDER — SODIUM CHLORIDE 0.9 % IV BOLUS
1000.0000 mL | Freq: Once | INTRAVENOUS | Status: AC
  Administered 2018-11-29: 1000 mL via INTRAVENOUS

## 2018-11-29 MED ORDER — ADENOSINE 6 MG/2ML IV SOLN
INTRAVENOUS | Status: AC
  Filled 2018-11-29: qty 2

## 2018-11-29 NOTE — ED Notes (Signed)
Patient placed on a monitor and pulse oximetry , IV site intact , NS IV infusing . HR= 207/min , relieved by Valsalva maneuver assisted by EDP. Heart rate slowed to 115/min. Denies chest pain /respirations unlabored .
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
## 2018-11-29 NOTE — ED Triage Notes (Signed)
Pt here in SVt rate of  212 , pt took 25mg  lopressor at 0438 prior to arrival , pt does c/o some sob but is a smoker

## 2019-12-21 ENCOUNTER — Telehealth: Payer: Self-pay | Admitting: Family Medicine

## 2019-12-21 ENCOUNTER — Ambulatory Visit
Admission: EM | Admit: 2019-12-21 | Discharge: 2019-12-21 | Disposition: A | Discharge status: Home / Self Care | Payer: 59 | Attending: Emergency Medicine | Admitting: Emergency Medicine

## 2019-12-21 DIAGNOSIS — R1031 Right lower quadrant pain: Secondary | ICD-10-CM | POA: Diagnosis not present

## 2019-12-21 LAB — POCT URINALYSIS DIP (MANUAL ENTRY)
Bilirubin, UA: NEGATIVE
Glucose, UA: NEGATIVE mg/dL
Ketones, POC UA: NEGATIVE mg/dL
Leukocytes, UA: NEGATIVE
Nitrite, UA: NEGATIVE
Protein Ur, POC: NEGATIVE mg/dL
Spec Grav, UA: 1.015 (ref 1.010–1.025)
Urobilinogen, UA: 0.2 E.U./dL
pH, UA: 5.5 (ref 5.0–8.0)

## 2019-12-21 NOTE — Telephone Encounter (Signed)
Is she currently on med? When did she last take it?  What is her current/prev dose?  Please let me know.  Thanks.

## 2019-12-21 NOTE — Discharge Instructions (Addendum)
Schedule follow up with PCP and/or GYN. In the interim, keep track of symptoms: when, aggravating and alleviating factors. Go to ER for worsening/constant pain, vomiting, fever, blood in stool or urine.

## 2019-12-21 NOTE — Telephone Encounter (Signed)
Pt schedule urgent care follow up for 11/9 She stated at her urgent care visit her bp was 166/104.  She stated she was on metoprolol and wanted to know if dr Para March could call this in today or did she need to wait to see him  See urgent care visit 10/8

## 2019-12-21 NOTE — ED Triage Notes (Signed)
Pt c/o sharp RLQ pain x3 lasting a few minutes since 7:30am. Denies nausea. Denies urinary difficulty.
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
## 2019-12-22 ENCOUNTER — Other Ambulatory Visit: Payer: Self-pay

## 2019-12-22 ENCOUNTER — Encounter: Payer: Self-pay | Admitting: Family Medicine

## 2019-12-22 ENCOUNTER — Ambulatory Visit (INDEPENDENT_AMBULATORY_CARE_PROVIDER_SITE_OTHER): Payer: 59 | Admitting: Family Medicine

## 2019-12-22 DIAGNOSIS — R109 Unspecified abdominal pain: Secondary | ICD-10-CM | POA: Diagnosis not present

## 2019-12-22 DIAGNOSIS — F411 Generalized anxiety disorder: Secondary | ICD-10-CM | POA: Diagnosis not present

## 2019-12-22 DIAGNOSIS — F17200 Nicotine dependence, unspecified, uncomplicated: Secondary | ICD-10-CM | POA: Diagnosis not present

## 2019-12-22 DIAGNOSIS — R000 Tachycardia, unspecified: Secondary | ICD-10-CM | POA: Diagnosis not present

## 2019-12-22 MED ORDER — NICOTINE 7 MG/24HR TD PT24: 7.0000 mg | MEDICATED_PATCH | Freq: Every day | TRANSDERMAL | 0 refills | Status: DC

## 2019-12-22 MED ORDER — NICOTINE 21 MG/24HR TD PT24: 21.0000 mg | MEDICATED_PATCH | Freq: Every day | TRANSDERMAL | 0 refills | Status: DC

## 2019-12-22 MED ORDER — CLONAZEPAM 0.5 MG PO TABS: ORAL_TABLET | ORAL | 1 refills | Status: DC

## 2019-12-22 MED ORDER — METOPROLOL TARTRATE 25 MG PO TABS: 25.0000 mg | ORAL_TABLET | Freq: Two times a day (BID) | ORAL | 3 refills | Status: AC | PRN

## 2019-12-22 MED ORDER — NICOTINE 14 MG/24HR TD PT24: 14.0000 mg | MEDICATED_PATCH | Freq: Every day | TRANSDERMAL | 0 refills | Status: DC

## 2019-12-22 NOTE — Patient Instructions (Addendum)
Nicotine patch dosing- 21mg  then 14 then 7.  2-4 weeks each.  Don't smoke on the patch.   Take care.  Glad to see you. Please think about getting vaccinated.   Use the metoprolol if needed.  Limit salt and start metoprolol if your BP is consistently >140/>90.

## 2019-12-22 NOTE — Progress Notes (Signed)
This visit occurred during the SARS-CoV-2 public health emergency.  Safety protocols were in place, including screening questions prior to the visit, additional usage of staff PPE, and extensive cleaning of exam room while observing appropriate contact time as indicated for disinfecting solutions.  She had possible BP elevation after eating sausage a few weeks ago.  She didn't feel well about 20 minutes after eating.  That resolved.    BP elevation at UC with right lower abd pain.  Pain started yesterday AM.  Sharp pain.  Less pain sitting, more pain walking.  Pain resolved at [facility].  She took TUMS in the meantime.  No sx in the meantime.  No FCNAVD.  She clearly felt better after flatus and burping.    BP improved today, compared to yesterday.  She is off BP meds at this point.  Had prev taken metoprolol intermittently.  She had h/o SVT last year, but not in the meantime.  She knows how to use the vagal maneuver.  She cut back on caffeine.    We talked about smoking cessation.  Discussed patch use.  Smoking 1 PPD.  D/w pt about routine dosing.    We talked about anxiety.  Mult family stressors.  H/o prn use of BZD.  D/w pt.  Safe at home.    Meds, vitals, and allergies reviewed.   ROS: Per HPI unless specifically indicated in ROS section   GEN: nad, alert and oriented HEENT: ncat NECK: supple w/o LA CV: rrr. PULM: ctab, no inc wob ABD: soft, +bs, not tender to palpation. EXT: no edema SKIN: no acute rash

## 2019-12-23 NOTE — Assessment & Plan Note (Signed)
Blood pressure is improved.  We talked about her previous tachycardia history.  She can use metoprolol if she has elevated heart rate, if vagal maneuvers did not take care of her symptoms.  She can also start if she has persistently elevated blood pressure.  Routine cautions given to patient.  She agrees with plan.

## 2019-12-23 NOTE — Assessment & Plan Note (Signed)
Fortunately resolved in the meantime after taking Tums/flatus/burping.  Reassuring exam.  She will update me as needed.

## 2019-12-23 NOTE — Assessment & Plan Note (Signed)
Smoking cessation discussed with patient, specifically regarding nicotine patch use and appropriate taper.  Routine cautions given to patient.  She understood.  At least 30 minutes were devoted to patient care in this encounter (this can potentially include time spent reviewing the patient's file/history, interviewing and examining the patient, counseling/reviewing plan with patient, ordering referrals, ordering tests, reviewing relevant laboratory or x-ray data, and documenting the encounter).

## 2019-12-23 NOTE — Assessment & Plan Note (Signed)
Multiple stressors discussed with patient.  History of.  Use of benzodiazepine.  Can continue as needed Klonopin.  She will update me as needed.  Okay for outpatient follow-up.  Safe at home.

## 2020-04-30 IMAGING — MG DIGITAL DIAGNOSTIC BILATERAL MAMMOGRAM WITH TOMO AND CAD
8 series · 8 of 24 positions shown · non-contrast
Comparison: Previous exam(s).

CLINICAL DATA: 51-year-old female presenting for evaluation of
nonfocal lateral breast pain. On the order requisition, this
physician identified a focal area of pain at 6 o'clock. During the
exam, the patient identified a separate area at 4 o'clock.

EXAM:
DIGITAL DIAGNOSTIC BILATERAL MAMMOGRAM WITH CAD AND TOMO
ULTRASOUND LEFT BREAST

[R MLO synth-2D]
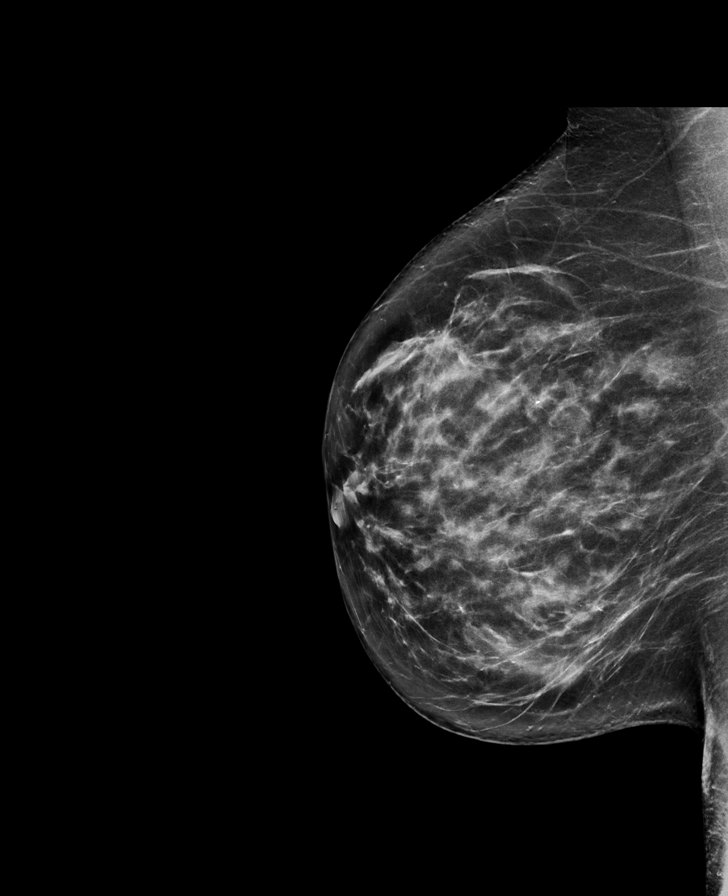

[L MLO synth-2D]
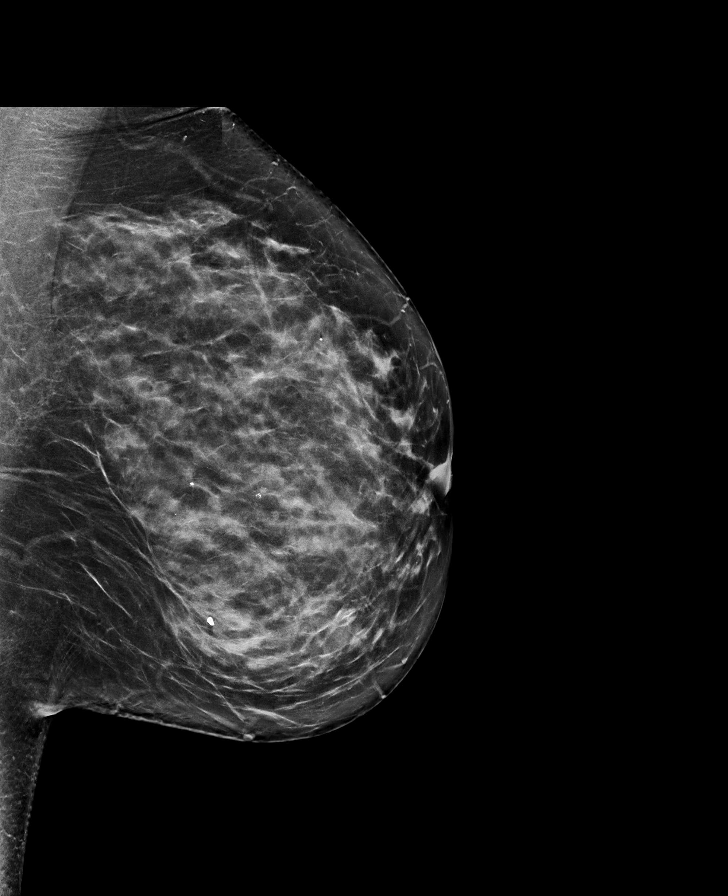

[R CC synth-2D]
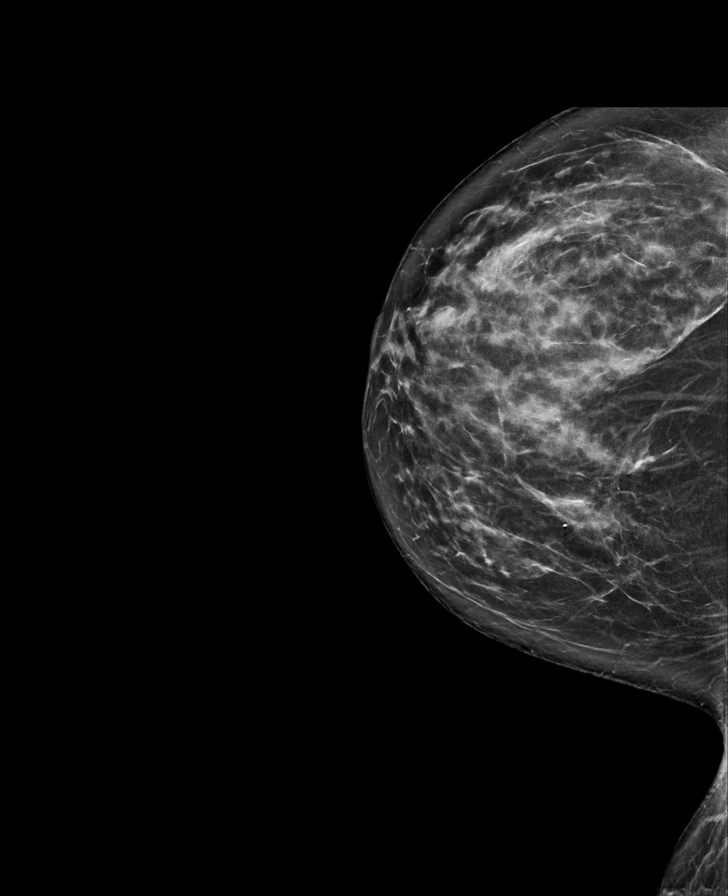

[L CC synth-2D]
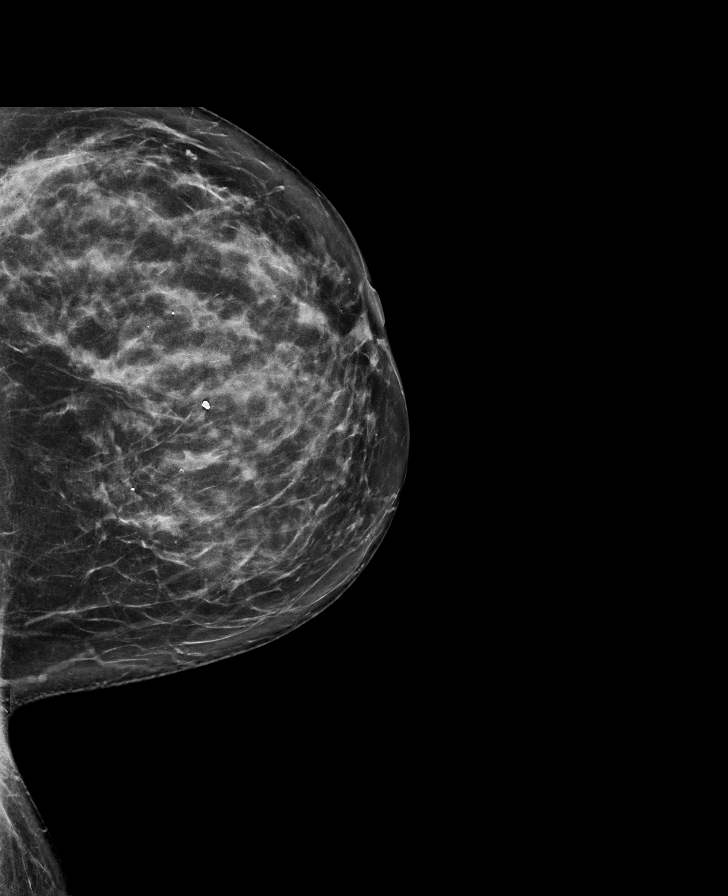

[L CC tomo · tomo slice 41/80.0]
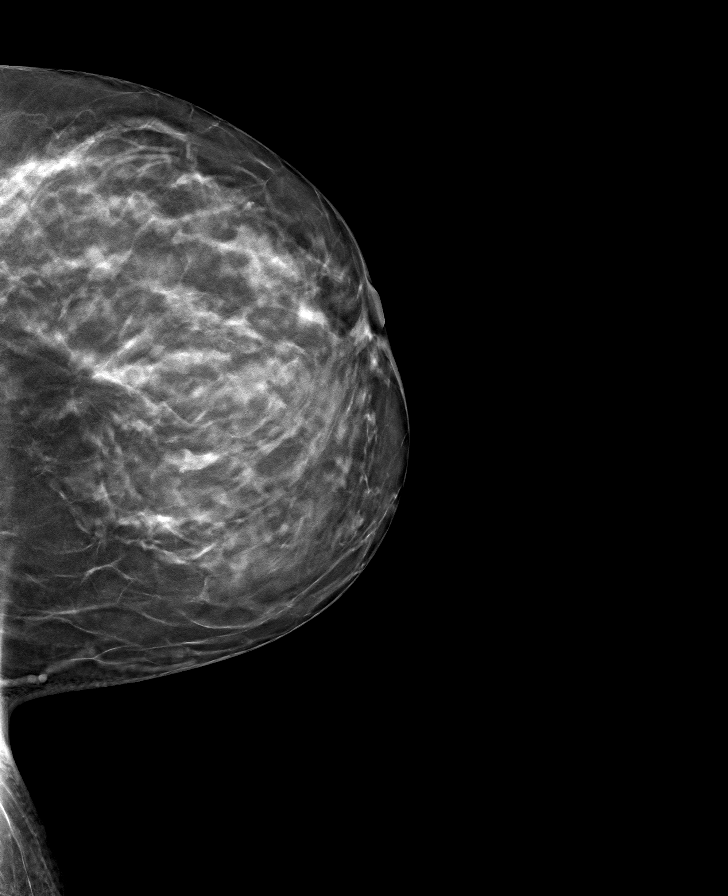

[L MLO tomo · tomo slice 43/86.0]
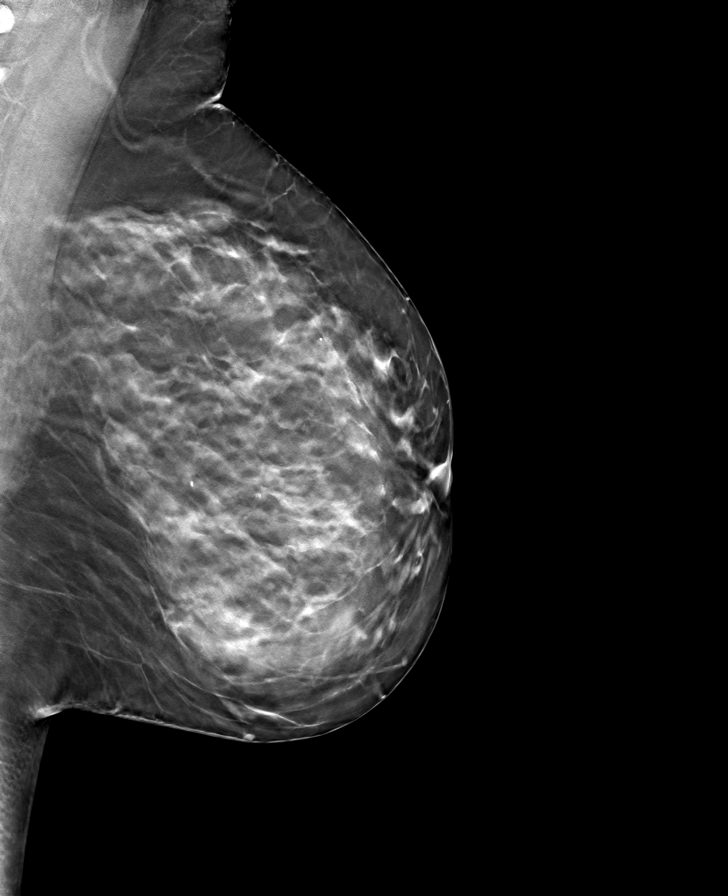

[R MLO tomo · tomo slice 41/82.0]
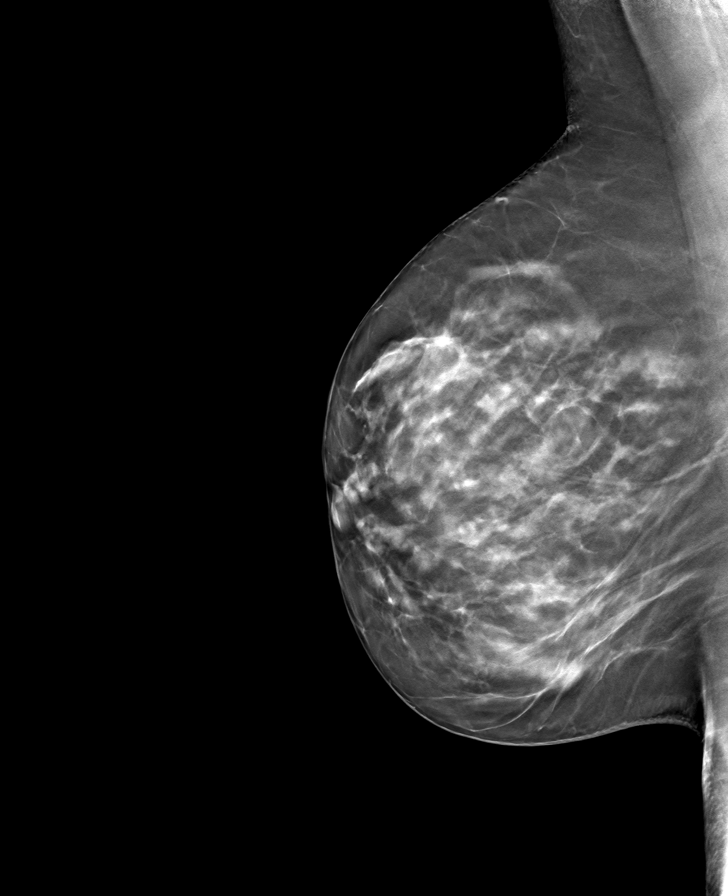

[R CC tomo · tomo slice 39/78.0]
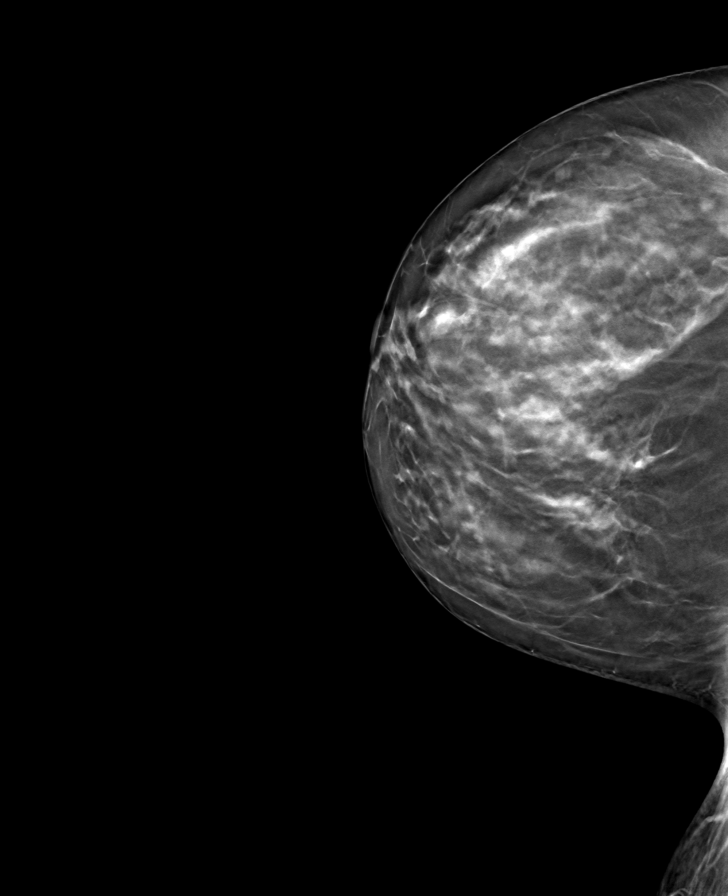

[8 of 24 positions shown; findings below may reference images not displayed]

ACR Breast Density Category c: The breast tissue is heterogeneously
dense, which may obscure small masses.
FINDINGS: No suspicious calcifications, masses or areas of distortion are seen
in the bilateral breasts.

Mammographic images were processed with CAD.

Ultrasound of the left breast at 6 o'clock and at 4 o'clock, 4 cm
from the nipple demonstrates normal fibroglandular tissue. No masses
or suspicious areas of shadowing are identified.
IMPRESSION: 1. There are no mammographic or targeted sonographic abnormalities
in the left breast to explain the patient's nonfocal breast pain.

2.  No mammographic evidence of malignancy in the bilateral breasts.

RECOMMENDATION:
1.  Screening mammogram in one year.(Code:CL-2-ME5)

2. Clinical follow-up recommended for the nonfocal lateral left
breast pain. Any further workup should be based on clinical grounds.

I have discussed the findings and recommendations with the patient.
Results were also provided in writing at the conclusion of the
visit. If applicable, a reminder letter will be sent to the patient
regarding the next appointment.

BI-RADS CATEGORY  1: Negative.

## 2020-06-28 ENCOUNTER — Telehealth: Payer: Self-pay

## 2020-06-28 NOTE — Telephone Encounter (Signed)
Pt called and requested an appt with Dr. Para March. She states that she has had right calf pain for two weeks. I asked her if it was red or warm to the touch. She states that it was not and that it only hurt sometimes. Pt denies SOB. I told her that I would transfer her to Access Nurse to triage and she refused. She states that she has no transportation to go anywhere and only wants to see Dr. Para March. She requested an appt for Thursday 06/30/20. Please advise. FYI

## 2020-06-28 NOTE — Telephone Encounter (Signed)
If no CP and not SOB, then okay to keep as scheduled.  Thanks.

## 2020-06-28 NOTE — Telephone Encounter (Signed)
Pt denies CP or SOB and will keep afternoon appt on Thurs

## 2020-06-28 NOTE — Telephone Encounter (Signed)
LMTCB

## 2020-06-30 ENCOUNTER — Encounter: Payer: Self-pay | Admitting: Family Medicine

## 2020-06-30 ENCOUNTER — Other Ambulatory Visit: Payer: Self-pay

## 2020-06-30 ENCOUNTER — Ambulatory Visit (INDEPENDENT_AMBULATORY_CARE_PROVIDER_SITE_OTHER): Payer: 59 | Admitting: Family Medicine

## 2020-06-30 VITALS — BP 118/78 | HR 81 | Temp 97.7°F | Ht 62.75 in | Wt 145.0 lb

## 2020-06-30 DIAGNOSIS — M7989 Other specified soft tissue disorders: Secondary | ICD-10-CM

## 2020-06-30 NOTE — Patient Instructions (Signed)
Let me see about getting the ultrasound set up for the near future.  We'll call about that.  Take care. Glad to see you.

## 2020-06-30 NOTE — Progress Notes (Signed)
This visit occurred during the SARS-CoV-2 public health emergency.  Safety protocols were in place, including screening questions prior to the visit, additional usage of staff PPE, and extensive cleaning of exam room while observing appropriate contact time as indicated for disinfecting solutions.  Leg swelling.  She is better in the meantime.  She thought R calf was slightly larger than L calf, R calf mildly sore at the time.  Not pain, but mildly sore "and it didn't feel 100% right.".  Sx started a few weeks ago.  She isn't exercising much at baseline.  No CP, not SOB. Not lightheaded.  No h/o DVT.  No trauma, no trigger.  No rash or skin changes.  Never felt a lump in the leg.  R leg prev "felt a little heavy." she didn't have sx in either arm.  No FCNAVD.  Smoking 1/2 PPD, she is cutting back.    Meds, vitals, and allergies reviewed.   ROS: Per HPI unless specifically indicated in ROS section   nad ncat Neck supple, no LA rrr ctab abd soft not ttp Normal DP pulses B R calf 33 cm. L calf 32 cm.   No erythema or bruising.  She is low risk by well's criteria.  Discussed.

## 2020-07-01 ENCOUNTER — Ambulatory Visit (HOSPITAL_COMMUNITY)
Admission: RE | Admit: 2020-07-01 | Discharge: 2020-07-01 | Disposition: A | Discharge status: Home / Self Care | Payer: 59 | Source: Ambulatory Visit | Attending: Family Medicine | Admitting: Family Medicine

## 2020-07-01 DIAGNOSIS — M7989 Other specified soft tissue disorders: Secondary | ICD-10-CM | POA: Diagnosis not present

## 2020-07-01 NOTE — Progress Notes (Signed)
Lower extremity venous RT study completed.  Preliminary results relayed to Para March, MD.   See CV Proc for preliminary results report.   Jean Rosenthal, RDMS, RVT

## 2020-07-03 DIAGNOSIS — M7989 Other specified soft tissue disorders: Secondary | ICD-10-CM | POA: Insufficient documentation

## 2020-07-03 NOTE — Assessment & Plan Note (Signed)
She is low risk but given the calf asymmetry I think it makes sense to get the ultrasound done.  See notes on imaging.  Addendum- ultrasound negative.  I think it makes sense to continue with supportive care in the meantime.  If she continues to have any discomfort or pain she can let me know.

## 2020-08-12 ENCOUNTER — Other Ambulatory Visit: Payer: Self-pay

## 2020-08-12 ENCOUNTER — Encounter: Payer: Self-pay | Admitting: Family Medicine

## 2020-08-12 ENCOUNTER — Ambulatory Visit (INDEPENDENT_AMBULATORY_CARE_PROVIDER_SITE_OTHER): Payer: 59 | Admitting: Family Medicine

## 2020-08-12 VITALS — BP 128/80 | HR 82 | Temp 97.1°F | Ht 63.0 in | Wt 141.0 lb

## 2020-08-12 DIAGNOSIS — E785 Hyperlipidemia, unspecified: Secondary | ICD-10-CM | POA: Diagnosis not present

## 2020-08-12 DIAGNOSIS — M7989 Other specified soft tissue disorders: Secondary | ICD-10-CM | POA: Diagnosis not present

## 2020-08-12 DIAGNOSIS — F411 Generalized anxiety disorder: Secondary | ICD-10-CM

## 2020-08-12 DIAGNOSIS — R42 Dizziness and giddiness: Secondary | ICD-10-CM

## 2020-08-12 DIAGNOSIS — F17200 Nicotine dependence, unspecified, uncomplicated: Secondary | ICD-10-CM

## 2020-08-12 LAB — LIPID PANEL
Cholesterol: 200 mg/dL (ref 0–200)
HDL: 49.4 mg/dL (ref >39.00)
LDL Cholesterol: 126 mg/dL — ABNORMAL HIGH (ref 0–99)
NonHDL: 150.61
Total CHOL/HDL Ratio: 4
Triglycerides: 121 mg/dL (ref 0.0–149.0)
VLDL: 24.2 mg/dL (ref 0.0–40.0)

## 2020-08-12 LAB — COMPREHENSIVE METABOLIC PANEL
ALT: 14 U/L (ref 0–35)
AST: 19 U/L (ref 0–37)
Albumin: 4.4 g/dL (ref 3.5–5.2)
Alkaline Phosphatase: 80 U/L (ref 39–117)
BUN: 10 mg/dL (ref 6–23)
CO2: 25 mEq/L (ref 19–32)
Calcium: 9.3 mg/dL (ref 8.4–10.5)
Chloride: 102 mEq/L (ref 96–112)
Creatinine, Ser: 0.84 mg/dL (ref 0.40–1.20)
GFR: 77.48 mL/min (ref >60.00)
Glucose, Bld: 89 mg/dL (ref 70–99)
Potassium: 4.4 mEq/L (ref 3.5–5.1)
Sodium: 136 mEq/L (ref 135–145)
Total Bilirubin: 0.6 mg/dL (ref 0.2–1.2)
Total Protein: 7.2 g/dL (ref 6.0–8.3)

## 2020-08-12 LAB — CBC WITH DIFFERENTIAL/PLATELET
Basophils Absolute: 0 10*3/uL (ref 0.0–0.1)
Basophils Relative: 0.5 % (ref 0.0–3.0)
Eosinophils Absolute: 0.3 10*3/uL (ref 0.0–0.7)
Eosinophils Relative: 3.9 % (ref 0.0–5.0)
HCT: 44.9 % (ref 36.0–46.0)
Hemoglobin: 15.7 g/dL — ABNORMAL HIGH (ref 12.0–15.0)
Lymphocytes Relative: 37.3 % (ref 12.0–46.0)
Lymphs Abs: 3 10*3/uL (ref 0.7–4.0)
MCHC: 35 g/dL (ref 30.0–36.0)
MCV: 96.1 fl (ref 78.0–100.0)
Monocytes Absolute: 0.4 10*3/uL (ref 0.1–1.0)
Monocytes Relative: 4.5 % (ref 3.0–12.0)
Neutro Abs: 4.4 10*3/uL (ref 1.4–7.7)
Neutrophils Relative %: 53.8 % (ref 43.0–77.0)
Platelets: 288 10*3/uL (ref 150.0–400.0)
RBC: 4.67 Mil/uL (ref 3.87–5.11)
RDW: 12.7 % (ref 11.5–15.5)
WBC: 8.2 10*3/uL (ref 4.0–10.5)

## 2020-08-12 LAB — TSH: TSH: 1.31 u[IU]/mL (ref 0.35–5.50)

## 2020-08-12 MED ORDER — CLONAZEPAM 0.5 MG PO TABS: ORAL_TABLET | ORAL | 1 refills | Status: DC

## 2020-08-12 NOTE — Progress Notes (Signed)
This visit occurred during the SARS-CoV-2 public health emergency.  Safety protocols were in place, including screening questions prior to the visit, additional usage of staff PPE, and extensive cleaning of exam room while observing appropriate contact time as indicated for disinfecting solutions.  Lightheaded.  H/o SVT but no recent sx- no known recent heart racing. She took a metoprolol and felt worse.  The room didn't spin.  She felt better after eating lunch.  She can be lightheaded prior to eating breakfast.  She doesn't feel better until after lunch.    Discussed smoking cessation.  She is trying to make plans to quit, encouraged.    L calf swelling.  Persists.  No pain.  Prev neg for DVT.  D/w pt about compression stocking use.  She tried medium sized stocking prev.    H/o anxiety.  She needed refill on klonopin.   Meds, vitals, and allergies reviewed.   ROS: Per HPI unless specifically indicated in ROS section   GEN: nad, alert and oriented HEENT: ncat NECK: supple w/o LA CV: rrr.  PULM: ctab, no inc wob ABD: soft, +bs EXT: no edema SKIN: no acute rash  R 32cm calf circumference.  L 31cm calf circumference.  Each is down 1cm from prev measurement.    Mildly lightheaded on standing.   Felt better sitting down.  Not tachycardic.

## 2020-08-12 NOTE — Patient Instructions (Signed)
Drink enough fluid to keep your urine clear.   Take a snack mid morning.  See if that helps.  Take care.  Glad to see you. Go to the lab on the way out.   If you have mychart we'll likely use that to update you.

## 2020-08-15 DIAGNOSIS — R42 Dizziness and giddiness: Secondary | ICD-10-CM | POA: Insufficient documentation

## 2020-08-15 NOTE — Assessment & Plan Note (Signed)
Encourage cessation.  Discussed options.  She will update me as needed.

## 2020-08-15 NOTE — Assessment & Plan Note (Addendum)
Unclear source.  She has a history of SVT but no recent known symptoms.  It could be that she has relative hypotension.  She could also have relative hypoglycemia.  She typically feels better after eating lunch.  Discussed taking a snack midmorning and also drinking more fluid.  See notes on labs.

## 2020-08-15 NOTE — Assessment & Plan Note (Signed)
Previous ultrasound negative.  No tenderness.  No acute findings now.  She does have 1 cm discrepancy but each calf is smaller in circumference than previous.  Would continue with compression stockings.

## 2020-08-15 NOTE — Assessment & Plan Note (Signed)
Continue as needed Klonopin.  No ADE on med.  She will update me as needed.

## 2020-08-20 LAB — HM MAMMOGRAPHY

## 2021-05-13 ENCOUNTER — Other Ambulatory Visit: Payer: Self-pay

## 2021-05-13 ENCOUNTER — Ambulatory Visit
Admission: EM | Admit: 2021-05-13 | Discharge: 2021-05-13 | Disposition: A | Discharge status: Home / Self Care | Payer: 59 | Attending: Physician Assistant | Admitting: Physician Assistant

## 2021-05-13 DIAGNOSIS — R220 Localized swelling, mass and lump, head: Secondary | ICD-10-CM

## 2021-05-13 DIAGNOSIS — K047 Periapical abscess without sinus: Secondary | ICD-10-CM | POA: Diagnosis not present

## 2021-05-13 MED ORDER — AMOXICILLIN 500 MG PO CAPS: 500.0000 mg | ORAL_CAPSULE | Freq: Three times a day (TID) | ORAL | 0 refills | Status: DC

## 2021-05-13 NOTE — ED Triage Notes (Signed)
Pt reports onset yesterday of top left tooth pain that has since resolved. This morning she woke up with left sided facial swelling. No meds taken. ?
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
## 2022-02-16 ENCOUNTER — Ambulatory Visit: Payer: 59 | Admitting: Family Medicine

## 2022-04-29 ENCOUNTER — Ambulatory Visit
Admission: EM | Admit: 2022-04-29 | Discharge: 2022-04-29 | Disposition: A | Discharge status: Home / Self Care | Payer: 59 | Attending: Internal Medicine | Admitting: Internal Medicine

## 2022-04-29 DIAGNOSIS — S93402A Sprain of unspecified ligament of left ankle, initial encounter: Secondary | ICD-10-CM | POA: Diagnosis not present
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
## 2022-04-29 NOTE — Discharge Instructions (Addendum)
Apply a compressive ACE bandage. Rest and elevate the affected painful area.  Apply cold compresses intermittently as needed.  As pain recedes, begin normal activities slowly as tolerated.  Call if symptoms persist.

## 2022-04-29 NOTE — ED Triage Notes (Signed)
Pt presents with left ankle injury after falling while taking her dog for walk X 2 days ago.

## 2022-05-01 ENCOUNTER — Encounter: Payer: Self-pay | Admitting: Family Medicine

## 2022-05-01 ENCOUNTER — Ambulatory Visit (INDEPENDENT_AMBULATORY_CARE_PROVIDER_SITE_OTHER): Payer: 59 | Admitting: Family Medicine

## 2022-05-01 ENCOUNTER — Ambulatory Visit (INDEPENDENT_AMBULATORY_CARE_PROVIDER_SITE_OTHER)
Admission: RE | Admit: 2022-05-01 | Discharge: 2022-05-01 | Disposition: A | Discharge status: Home / Self Care | Payer: 59 | Source: Ambulatory Visit | Attending: Family Medicine | Admitting: Family Medicine

## 2022-05-01 VITALS — BP 150/90 | HR 98 | Temp 97.9°F | Ht 63.0 in | Wt 148.0 lb

## 2022-05-01 DIAGNOSIS — F411 Generalized anxiety disorder: Secondary | ICD-10-CM

## 2022-05-01 DIAGNOSIS — M25572 Pain in left ankle and joints of left foot: Secondary | ICD-10-CM

## 2022-05-01 MED ORDER — CLONAZEPAM 0.5 MG PO TABS: ORAL_TABLET | ORAL | 1 refills | Status: AC

## 2022-05-01 NOTE — Progress Notes (Unsigned)
No recent use of metoprolol.   Rare use BZD.  Cautions d/w pt.  Rx printed.  Given to patient.  L ankle swelling.  [place] 04/28/22.  [place] in a hole walking the dog.  Went to ground.  No LOC.  Hasn't used a brace yet.  L foot with lateral bruising.  Able to bear weight.  Pain with dorsiflexion.    She has ASO brace to use.    Meds, vitals, and allergies reviewed.   ROS: Per HPI unless specifically indicated in ROS section   [place] Speech normal. Judgment appears intact. L lateral foot bruised.   Able to bear weight. Normal dorsalis pedis pulse. L lateral mal ttp.   L medial mal not ttp Midfoot nontender.  Fifth metatarsal nontender.  X-rays reviewed and discussed with patient at office visit.

## 2022-05-01 NOTE — Patient Instructions (Addendum)
Schedule a yearly visit for the summer.   Use the ankle brace and ice for 5 minutes at a time as needed.    If your BP is still above 140/90 and your pulse is above 60, then start metoprolol 25mg  twice a day.   If your BP is still above 140/90 with the med, then let me know.   Take care.  Glad to see you.

## 2022-05-02 NOTE — Assessment & Plan Note (Signed)
Continue as needed benzodiazepine.  No adverse effect on medication.

## 2022-05-02 NOTE — Assessment & Plan Note (Signed)
She has an ASO brace at home to use.  She can use that and ice as needed.  Routine cautions given to patient for icing and bracing.  Update me as needed.  Images reviewed and discussed with patient at office visit.  No fracture seen.

## 2022-11-16 ENCOUNTER — Other Ambulatory Visit: Payer: Self-pay | Admitting: Family Medicine

## 2022-11-16 DIAGNOSIS — Z1212 Encounter for screening for malignant neoplasm of rectum: Secondary | ICD-10-CM

## 2022-11-16 DIAGNOSIS — Z1211 Encounter for screening for malignant neoplasm of colon: Secondary | ICD-10-CM
